# Patient Record
Sex: Male | Born: 1970 | Race: Black or African American | Hispanic: No | Marital: Married | Smoking: Never smoker
Health system: Southern US, Community
[De-identification: ages and names within clinical notes are randomized; demographics above are authoritative.]

## PROBLEM LIST (undated history)

## (undated) DIAGNOSIS — I10 Essential (primary) hypertension: Secondary | ICD-10-CM

## (undated) HISTORY — PX: KIDNEY DONATION: SHX685

## (undated) HISTORY — PX: KNEE ARTHROSCOPY W/ MEDIAL COLLATERAL LIGAMENT (MCL) REPAIR: SHX1876

---

## 2013-08-25 DIAGNOSIS — Z524 Kidney donor: Secondary | ICD-10-CM | POA: Insufficient documentation

## 2018-11-18 DIAGNOSIS — I10 Essential (primary) hypertension: Secondary | ICD-10-CM | POA: Diagnosis not present

## 2018-11-18 DIAGNOSIS — J069 Acute upper respiratory infection, unspecified: Secondary | ICD-10-CM | POA: Diagnosis not present

## 2018-11-18 DIAGNOSIS — Z6828 Body mass index (BMI) 28.0-28.9, adult: Secondary | ICD-10-CM | POA: Diagnosis not present

## 2019-01-14 DIAGNOSIS — J309 Allergic rhinitis, unspecified: Secondary | ICD-10-CM | POA: Diagnosis not present

## 2019-01-14 DIAGNOSIS — J45909 Unspecified asthma, uncomplicated: Secondary | ICD-10-CM | POA: Diagnosis not present

## 2019-01-14 DIAGNOSIS — I10 Essential (primary) hypertension: Secondary | ICD-10-CM | POA: Diagnosis not present

## 2019-02-16 DIAGNOSIS — I10 Essential (primary) hypertension: Secondary | ICD-10-CM | POA: Diagnosis not present

## 2019-02-16 DIAGNOSIS — J45909 Unspecified asthma, uncomplicated: Secondary | ICD-10-CM | POA: Diagnosis not present

## 2019-02-16 DIAGNOSIS — J309 Allergic rhinitis, unspecified: Secondary | ICD-10-CM | POA: Diagnosis not present

## 2019-05-05 ENCOUNTER — Other Ambulatory Visit: Payer: Self-pay | Admitting: *Deleted

## 2019-05-05 DIAGNOSIS — Z20822 Contact with and (suspected) exposure to covid-19: Secondary | ICD-10-CM

## 2019-05-11 LAB — NOVEL CORONAVIRUS, NAA: SARS-CoV-2, NAA: NOT DETECTED

## 2019-09-19 ENCOUNTER — Other Ambulatory Visit: Payer: Self-pay

## 2019-09-19 DIAGNOSIS — Z20822 Contact with and (suspected) exposure to covid-19: Secondary | ICD-10-CM

## 2019-09-22 LAB — NOVEL CORONAVIRUS, NAA: SARS-CoV-2, NAA: NOT DETECTED

## 2020-02-15 ENCOUNTER — Other Ambulatory Visit: Payer: Self-pay

## 2020-02-15 ENCOUNTER — Encounter (INDEPENDENT_AMBULATORY_CARE_PROVIDER_SITE_OTHER): Payer: Self-pay | Admitting: Otolaryngology

## 2020-02-15 ENCOUNTER — Ambulatory Visit (INDEPENDENT_AMBULATORY_CARE_PROVIDER_SITE_OTHER): Payer: Self-pay | Admitting: Otolaryngology

## 2020-02-15 VITALS — Temp 97.7°F

## 2020-02-15 DIAGNOSIS — M26609 Unspecified temporomandibular joint disorder, unspecified side: Secondary | ICD-10-CM

## 2020-02-15 DIAGNOSIS — H60312 Diffuse otitis externa, left ear: Secondary | ICD-10-CM

## 2020-02-15 NOTE — Progress Notes (Signed)
HPI: Jason Olsen is a 49 y.o. male who returns today for evaluation of left ear discomfort.  He noticed this worse when he was chewing gum.  He just recently came back from a trip.  He has not noted any hearing problems.  No drainage from the ear..  No past medical history on file.  Social History   Socioeconomic History  . Marital status: Married    Spouse name: Not on file  . Number of children: Not on file  . Years of education: Not on file  . Highest education level: Not on file  Occupational History  . Not on file  Tobacco Use  . Smoking status: Never Smoker  . Smokeless tobacco: Never Used  Substance and Sexual Activity  . Alcohol use: Not on file  . Drug use: Not on file  . Sexual activity: Not on file  Other Topics Concern  . Not on file  Social History Narrative  . Not on file   Social Determinants of Health   Financial Resource Strain:   . Difficulty of Paying Living Expenses:   Food Insecurity:   . Worried About Programme researcher, broadcasting/film/video in the Last Year:   . Barista in the Last Year:   Transportation Needs:   . Freight forwarder (Medical):   Marland Kitchen Lack of Transportation (Non-Medical):   Physical Activity:   . Days of Exercise per Week:   . Minutes of Exercise per Session:   Stress:   . Feeling of Stress :   Social Connections:   . Frequency of Communication with Friends and Family:   . Frequency of Social Gatherings with Friends and Family:   . Attends Religious Services:   . Active Member of Clubs or Organizations:   . Attends Banker Meetings:   Marland Kitchen Marital Status:    No family history on file. Allergies  Allergen Reactions  . Penicillins    Prior to Admission medications   Not on File     Positive ROS: Otherwise negative  All other systems have been reviewed and were otherwise negative with the exception of those mentioned in the HPI and as above.  Physical Exam: Constitutional: Alert, well-appearing, no acute  distress Ears: External ears without lesions or tenderness.  Right external ear canal is clear with normal-appearing cerumen that was removed.  Right TM is clear.  Left external ear canal has slight inflammation but no drainage and no significant swelling.  This was cleaned with suction and applied CSF powder to the left ear canal.  The left TM is clear with good mobility on pneumatic otoscopy.  Hearing screening with tuning forks revealed symmetric hearing in both ears with AC > BC bilaterally. Nasal: External nose without lesions. Clear nasal passages Oral: Lips and gums without lesions. Tongue and palate mucosa without lesions. Posterior oropharynx clear. Neck: No palpable adenopathy or masses.  He has slight discomfort on opening and closing his mouth in the left TMJ region. Respiratory: Breathing comfortably  Skin: No facial/neck lesions or rash noted.  Cerumen impaction removal  Date/Time: 02/15/2020 4:26 PM Performed by: Drema Halon, MD Authorized by: Drema Halon, MD   Consent:    Consent obtained:  Verbal   Consent given by:  Patient   Risks discussed:  Pain and bleeding Procedure details:    Location:  L ear and R ear   Procedure type: suction   Post-procedure details:    Inspection:  TM intact and canal  normal   Hearing quality:  Improved   Patient tolerance of procedure:  Tolerated well, no immediate complications Comments:     Both ear canals were cleaned.  TMs were clear bilaterally.  He had questionable left external otitis.  I applied CSF powder to the left ear canal.    Assessment: Left otalgia questionable secondary to mild left external otitis versus left TMJ dysfunction.  Plan: Suggested initially taking nostril anti-inflammatory medication. Prescribed Cortisporin otic suspension 4 to 5 drops twice daily for 5 days if he has any persistent left ear pain or drainage from the ear.   Radene Journey, MD

## 2020-10-27 ENCOUNTER — Ambulatory Visit: Payer: Self-pay | Attending: Internal Medicine

## 2020-10-27 DIAGNOSIS — Z23 Encounter for immunization: Secondary | ICD-10-CM

## 2020-10-27 NOTE — Progress Notes (Signed)
   Covid-19 Vaccination Clinic  Name:  [name]    MRN: [id number] DOB: 1971-08-11  10/27/2020  Mr. Arzuaga was observed post Covid-19 immunization for 15 minutes without incident. He was provided with Vaccine Information Sheet and instruction to access the V-Safe system.   Mr. Heist was instructed to call 911 with any severe reactions post vaccine: Marland Kitchen Difficulty breathing  . Swelling of face and throat  . A fast heartbeat  . A bad rash all over body  . Dizziness and weakness   Immunizations Administered    Name Date Dose VIS Date Route   Moderna Covid-19 Booster Vaccine 10/27/2020  1:28 PM 0.25 mL 08/17/2020 Intramuscular   Manufacturer: Moderna   Lot: [id number]   NDC: 38333-832-91

## 2021-08-17 DIAGNOSIS — Z524 Kidney donor: Secondary | ICD-10-CM | POA: Insufficient documentation

## 2021-08-17 DIAGNOSIS — E119 Type 2 diabetes mellitus without complications: Secondary | ICD-10-CM | POA: Insufficient documentation

## 2021-08-17 DIAGNOSIS — R7303 Prediabetes: Secondary | ICD-10-CM | POA: Insufficient documentation

## 2021-08-17 DIAGNOSIS — E1169 Type 2 diabetes mellitus with other specified complication: Secondary | ICD-10-CM | POA: Insufficient documentation

## 2021-08-17 DIAGNOSIS — I10 Essential (primary) hypertension: Secondary | ICD-10-CM | POA: Insufficient documentation

## 2021-08-17 DIAGNOSIS — I152 Hypertension secondary to endocrine disorders: Secondary | ICD-10-CM | POA: Insufficient documentation

## 2021-08-17 NOTE — Patient Instructions (Addendum)
It was nice to meet you.   Your BP goal is less than 130/80.   Blood work was ordered.     Medications changes include :  start lisinopril 10 mg daily   Your prescription(s) have been submitted to your pharmacy. Please take as directed and contact our office if you believe you are having problem(s) with the medication(s).   A referral was ordered for GI for a colonoscopy.       Someone from their office will call you to schedule an appointment.     Please followup in 6 months   Health Maintenance, Male Adopting a healthy lifestyle and getting preventive care are important in promoting health and wellness. Ask your health care provider about: The right schedule for you to have regular tests and exams. Things you can do on your own to prevent diseases and keep yourself healthy. What should I know about diet, weight, and exercise? Eat a healthy diet  Eat a diet that includes plenty of vegetables, fruits, low-fat dairy products, and lean protein. Do not eat a lot of foods that are high in solid fats, added sugars, or sodium. Maintain a healthy weight Body mass index (BMI) is a measurement that can be used to identify possible weight problems. It estimates body fat based on height and weight. Your health care provider can help determine your BMI and help you achieve or maintain a healthy weight. Get regular exercise Get regular exercise. This is one of the most important things you can do for your health. Most adults should: Exercise for at least 150 minutes each week. The exercise should increase your heart rate and make you sweat (moderate-intensity exercise). Do strengthening exercises at least twice a week. This is in addition to the moderate-intensity exercise. Spend less time sitting. Even light physical activity can be beneficial. Watch cholesterol and blood lipids Have your blood tested for lipids and cholesterol at 49 years of age, then have this test every 5 years. You  may need to have your cholesterol levels checked more often if: Your lipid or cholesterol levels are high. You are older than 48 years of age. You are at high risk for heart disease. What should I know about cancer screening? Many types of cancers can be detected early and may often be prevented. Depending on your health history and family history, you may need to have cancer screening at various ages. This may include screening for: Colorectal cancer. Prostate cancer. Skin cancer. Lung cancer. What should I know about heart disease, diabetes, and high blood pressure? Blood pressure and heart disease High blood pressure causes heart disease and increases the risk of stroke. This is more likely to develop in people who have high blood pressure readings, are of African descent, or are overweight. Talk with your health care provider about your target blood pressure readings. Have your blood pressure checked: Every 3-5 years if you are 72-67 years of age. Every year if you are 43 years old or older. If you are between the ages of 22 and 97 and are a current or former smoker, ask your health care provider if you should have a one-time screening for abdominal aortic aneurysm (AAA). Diabetes Have regular diabetes screenings. This checks your fasting blood sugar level. Have the screening done: Once every three years after age 72 if you are at a normal weight and have a low risk for diabetes. More often and at a younger age if you are overweight or have a high  risk for diabetes. What should I know about preventing infection? Hepatitis B If you have a higher risk for hepatitis B, you should be screened for this virus. Talk with your health care provider to find out if you are at risk for hepatitis B infection. Hepatitis C Blood testing is recommended for: Everyone born from [birth year] through 1963. Anyone with known risk factors for hepatitis C. Sexually transmitted infections (STIs) You should be  screened each year for STIs, including gonorrhea and chlamydia, if: You are sexually active and are younger than 48 years of age. You are older than 48 years of age and your health care provider tells you that you are at risk for this type of infection. Your sexual activity has changed since you were last screened, and you are at increased risk for chlamydia or gonorrhea. Ask your health care provider if you are at risk. Ask your health care provider about whether you are at high risk for HIV. Your health care provider may recommend a prescription medicine to help prevent HIV infection. If you choose to take medicine to prevent HIV, you should first get tested for HIV. You should then be tested every 3 months for as long as you are taking the medicine. Follow these instructions at home: Lifestyle Do not use any products that contain nicotine or tobacco, such as cigarettes, e-cigarettes, and chewing tobacco. If you need help quitting, ask your health care provider. Do not use street drugs. Do not share needles. Ask your health care provider for help if you need support or information about quitting drugs. Alcohol use Do not drink alcohol if your health care provider tells you not to drink. If you drink alcohol: Limit how much you have to 0-2 drinks a day. Be aware of how much alcohol is in your drink. In the [place] one drink equals one 12 oz bottle of beer (355 mL), one 5 oz glass of wine (148 mL), or one 1 oz glass of hard liquor (44 mL). General instructions Schedule regular health, dental, and eye exams. Stay current with your vaccines. Tell your health care provider if: You often feel depressed. You have ever been abused or do not feel safe at home. Summary Adopting a healthy lifestyle and getting preventive care are important in promoting health and wellness. Follow your health care provider's instructions about healthy diet, exercising, and getting tested or screened for  diseases. Follow your health care provider's instructions on monitoring your cholesterol and blood pressure. This information is not intended to replace advice given to you by your health care provider. Make sure you discuss any questions you have with your health care provider. Document Revised: 12/23/2020 Document Reviewed: 10/08/2018 Elsevier Patient Education  2022 [employer].

## 2021-08-17 NOTE — Progress Notes (Signed)
Subjective:    Patient ID: Jason Olsen, male    DOB: 29-May-1971, 50 y.o.   MRN: [id number]   This visit occurred during the SARS-CoV-2 public health emergency.  Safety protocols were in place, including screening questions prior to the visit, additional usage of staff PPE, and extensive cleaning of exam room while observing appropriate contact time as indicated for disinfecting solutions.   HPI He is here to establish with a new pcp.   He is here for a physical exam.  His wife is here with him.  He is concerned about his blood pressure-when he does check it at home it tends to be higher than usual.  He is also concerned about his kidney.  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    Medications and allergies reviewed with patient and updated if appropriate.  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     B Complex-C (B-COMPLEX WITH VITAMIN C) tablet Take by mouth.     fluticasone (FLONASE) 50 MCG/ACT nasal spray 2 Sprays by each nostril route Once a Day.     tadalafil (CIALIS) 10 MG tablet Take 10 mg by mouth as needed.     No current facility-administered medications on file prior to visit.    History reviewed. No pertinent past medical history.  History reviewed. No pertinent surgical history.  Social History   Socioeconomic History   Marital status: Married    Spouse name: Not on file   Number of children: Not on file   Years of education: Not on file   Highest education level: Not on file  Occupational History   Not on file  Tobacco Use   Smoking status: Never   Smokeless tobacco:  Never  Substance and Sexual Activity   Alcohol use: Not on file   Drug use: Not on file   Sexual activity: Not on file  Other Topics Concern   Not on file  Social History Narrative   Not on file   Social Determinants of Health   Financial Resource Strain: Not on file  Food Insecurity: Not on file  Transportation Needs: Not on file  Physical Activity: Not on file  Stress: Not on file  Social Connections: Not on file    Family History  Problem Relation Age of Onset   Cancer Mother        breast   Hypertension Mother    Cerebral aneurysm Mother    Kidney disease Father    Heart disease Father    Diabetes Father     Review of Systems  Constitutional:  Negative for chills and fever.  HENT:  Negative for rhinorrhea.   Eyes:  Negative for visual disturbance.  Respiratory:  Negative for cough, shortness of breath and wheezing.   Cardiovascular:  Positive for palpitations (1-2 times) and leg swelling (mild - wears comopresion socks). Negative for chest pain.  Gastrointestinal:  Negative for abdominal pain, blood in stool, constipation, diarrhea and nausea.       Rare gerd  Genitourinary:  Negative for difficulty urinating and dysuria.  Musculoskeletal:  Positive for arthralgias (mild) and back pain (lower - tightness).  Skin:  Negative for rash.  Neurological:  Negative for dizziness, light-headedness and headaches.  Psychiatric/Behavioral:  Negative for dysphoric mood. The patient is not nervous/anxious.       Objective:   Vitals:   08/18/21 1014  BP: (!) 150/98  Pulse: 80  Temp: 98.1 F (36.7 C)  SpO2: 97%   Filed Weights   08/18/21 1014  Weight: 221 lb (100.2 kg)   Body mass index is 30.82 kg/m.  BP Readings from Last 3 Encounters:  08/18/21 (!) 150/98    Wt Readings from Last 3 Encounters:  08/18/21 221 lb (100.2 kg)     Physical Exam Constitutional: He appears well-developed and well-nourished. No distress.  HENT:  Head: Normocephalic and  atraumatic.  Right Ear: External ear normal.  Left Ear: External ear normal.  Mouth/Throat: Oropharynx is clear and moist.  Normal ear canals and TM b/l  Eyes: Conjunctivae and EOM are normal.  Neck: Neck supple. No tracheal deviation present. No thyromegaly present.  No carotid bruit  Cardiovascular: Normal rate, regular rhythm, normal heart sounds and intact distal pulses.   No murmur heard. Pulmonary/Chest: Effort normal and breath sounds normal. No respiratory distress. He has no wheezes. He has no rales.  Abdominal: Soft. He exhibits no distension. There is no tenderness.  Genitourinary: deferred  Musculoskeletal: He exhibits no edema.  Lymphadenopathy:   He has no cervical adenopathy.  Skin: Skin is warm and dry. He is not diaphoretic.  Psychiatric: He has a normal mood and affect. His behavior is normal.         Assessment & Plan:   Physical exam: Screening blood work  ordered Exercise   not regular-stopped when he had some back pain Weight  ok for build Substance abuse   none   Reviewed recommended immunizations.  Referral ordered for GI for screening colonoscopy   Health Maintenance  Topic Date Due   COLONOSCOPY (Pts 45-2yrs Insurance coverage will need to be confirmed)  Never done   COVID-19 Vaccine (1) 09/03/2021 (Originally 08/14/1971)   INFLUENZA VACCINE  01/26/2022 (Originally 05/29/2021)   TETANUS/TDAP  03/10/2023   Hepatitis C Screening  Completed   HIV Screening  Completed   Pneumococcal Vaccine 62-14 Years old  Aged Out   HPV VACCINES  Aged Out     See Problem List for Assessment and Plan of chronic medical problems.

## 2021-08-18 ENCOUNTER — Other Ambulatory Visit: Payer: Self-pay

## 2021-08-18 ENCOUNTER — Ambulatory Visit: Payer: 59 | Admitting: Internal Medicine

## 2021-08-18 ENCOUNTER — Encounter: Payer: Self-pay | Admitting: Internal Medicine

## 2021-08-18 VITALS — BP 150/98 | HR 80 | Temp 98.1°F | Ht 71.0 in | Wt 221.0 lb

## 2021-08-18 DIAGNOSIS — I10 Essential (primary) hypertension: Secondary | ICD-10-CM | POA: Diagnosis not present

## 2021-08-18 DIAGNOSIS — Z125 Encounter for screening for malignant neoplasm of prostate: Secondary | ICD-10-CM

## 2021-08-18 DIAGNOSIS — J309 Allergic rhinitis, unspecified: Secondary | ICD-10-CM | POA: Diagnosis not present

## 2021-08-18 DIAGNOSIS — Z524 Kidney donor: Secondary | ICD-10-CM | POA: Diagnosis not present

## 2021-08-18 DIAGNOSIS — Z0000 Encounter for general adult medical examination without abnormal findings: Secondary | ICD-10-CM | POA: Diagnosis not present

## 2021-08-18 DIAGNOSIS — Z1211 Encounter for screening for malignant neoplasm of colon: Secondary | ICD-10-CM

## 2021-08-18 DIAGNOSIS — E782 Mixed hyperlipidemia: Secondary | ICD-10-CM

## 2021-08-18 DIAGNOSIS — R7303 Prediabetes: Secondary | ICD-10-CM | POA: Diagnosis not present

## 2021-08-18 DIAGNOSIS — E1169 Type 2 diabetes mellitus with other specified complication: Secondary | ICD-10-CM | POA: Insufficient documentation

## 2021-08-18 DIAGNOSIS — E785 Hyperlipidemia, unspecified: Secondary | ICD-10-CM | POA: Insufficient documentation

## 2021-08-18 DIAGNOSIS — J45909 Unspecified asthma, uncomplicated: Secondary | ICD-10-CM

## 2021-08-18 LAB — COMPREHENSIVE METABOLIC PANEL
ALT: 33 U/L (ref 0–53)
AST: 27 U/L (ref 0–37)
Albumin: 4.6 g/dL (ref 3.5–5.2)
Alkaline Phosphatase: 93 U/L (ref 39–117)
BUN: 15 mg/dL (ref 6–23)
CO2: 31 mEq/L (ref 19–32)
Calcium: 9.4 mg/dL (ref 8.4–10.5)
Chloride: 103 mEq/L (ref 96–112)
Creatinine, Ser: 1.32 mg/dL (ref 0.40–1.50)
GFR: 63.78 mL/min (ref >60.00)
Glucose, Bld: 107 mg/dL — ABNORMAL HIGH (ref 70–99)
Potassium: 4.2 mEq/L (ref 3.5–5.1)
Sodium: 140 mEq/L (ref 135–145)
Total Bilirubin: 0.6 mg/dL (ref 0.2–1.2)
Total Protein: 7.8 g/dL (ref 6.0–8.3)

## 2021-08-18 LAB — TSH: TSH: 1.06 u[IU]/mL (ref 0.35–5.50)

## 2021-08-18 LAB — CBC WITH DIFFERENTIAL/PLATELET
Basophils Absolute: 0 10*3/uL (ref 0.0–0.1)
Basophils Relative: 0.5 % (ref 0.0–3.0)
Eosinophils Absolute: 0.2 10*3/uL (ref 0.0–0.7)
Eosinophils Relative: 3.6 % (ref 0.0–5.0)
HCT: 43.8 % (ref 39.0–52.0)
Hemoglobin: 14.7 g/dL (ref 13.0–17.0)
Lymphocytes Relative: 39.2 % (ref 12.0–46.0)
Lymphs Abs: 1.8 10*3/uL (ref 0.7–4.0)
MCHC: 33.5 g/dL (ref 30.0–36.0)
MCV: 86.4 fl (ref 78.0–100.0)
Monocytes Absolute: 0.5 10*3/uL (ref 0.1–1.0)
Monocytes Relative: 10.4 % (ref 3.0–12.0)
Neutro Abs: 2.1 10*3/uL (ref 1.4–7.7)
Neutrophils Relative %: 46.3 % (ref 43.0–77.0)
Platelets: 205 10*3/uL (ref 150.0–400.0)
RBC: 5.07 Mil/uL (ref 4.22–5.81)
RDW: 13 % (ref 11.5–15.5)
WBC: 4.5 10*3/uL (ref 4.0–10.5)

## 2021-08-18 LAB — LIPID PANEL
Cholesterol: 183 mg/dL (ref 0–200)
HDL: 49.4 mg/dL (ref >39.00)
LDL Cholesterol: 121 mg/dL — ABNORMAL HIGH (ref 0–99)
NonHDL: 133.33
Total CHOL/HDL Ratio: 4
Triglycerides: 61 mg/dL (ref 0.0–149.0)
VLDL: 12.2 mg/dL (ref 0.0–40.0)

## 2021-08-18 LAB — HEMOGLOBIN A1C: Hgb A1c MFr Bld: 6.6 % — ABNORMAL HIGH (ref 4.6–6.5)

## 2021-08-18 MED ORDER — CETIRIZINE HCL 10 MG PO TABS: 10.0000 mg | ORAL_TABLET | Freq: Every day | ORAL | 11 refills | Status: DC

## 2021-08-18 MED ORDER — ALBUTEROL SULFATE HFA 108 (90 BASE) MCG/ACT IN AERS: 1.0000 | INHALATION_SPRAY | RESPIRATORY_TRACT | 8 refills | Status: DC | PRN

## 2021-08-18 MED ORDER — MONTELUKAST SODIUM 10 MG PO TABS: 10.0000 mg | ORAL_TABLET | Freq: Every day | ORAL | 2 refills | Status: DC

## 2021-08-18 MED ORDER — LISINOPRIL 10 MG PO TABS: 10.0000 mg | ORAL_TABLET | Freq: Every day | ORAL | 3 refills | Status: DC

## 2021-08-18 MED ORDER — AMLODIPINE BESYLATE 5 MG PO TABS: 5.0000 mg | ORAL_TABLET | Freq: Every day | ORAL | 2 refills | Status: DC

## 2021-08-18 NOTE — Assessment & Plan Note (Signed)
Chronic Check a1c Low sugar / carb diet Stressed regular exercise  

## 2021-08-18 NOTE — Assessment & Plan Note (Signed)
Chronic Regular exercise and healthy diet encouraged Check lipid panel  Continue atorvastatin 40 mg daily 

## 2021-08-18 NOTE — Assessment & Plan Note (Signed)
Chronic Controlled Continue montelukast 10 mg nightly, Zyrtec 10 mg daily

## 2021-08-18 NOTE — Assessment & Plan Note (Signed)
Chronic Not ideally controlled Currently taking amlodipine 5 mg daily-continue Start lisinopril 10 mg daily-he did well with lisinopril in the past CMP Advised him to monitor his blood pressure at home-discussed goal of less than 130/80 Stressed low-sodium diet, regular exercise, weight loss

## 2021-08-18 NOTE — Assessment & Plan Note (Signed)
CMP today Stressed good blood pressure control No NSAIDs Good hydration Advised weight loss Low-sodium diet Stressed good sugar control

## 2021-08-18 NOTE — Assessment & Plan Note (Signed)
Chronic Exercise-induced Continue albuterol inhaler as needed

## 2021-08-21 LAB — PSA, TOTAL AND FREE
PSA, % Free: 60 % (calc) (ref >25)
PSA, Free: 0.3 ng/mL
PSA, Total: 0.5 ng/mL (ref <=4.0)

## 2021-11-02 ENCOUNTER — Encounter: Payer: Self-pay | Admitting: Internal Medicine

## 2021-11-02 NOTE — Progress Notes (Addendum)
Subjective:    Patient ID: Jason Olsen, male    DOB: [birth year], 51 y.o.   MRN: [id number]  This visit occurred during the SARS-CoV-2 public health emergency.  Safety protocols were in place, including screening questions prior to the visit, additional usage of staff PPE, and extensive cleaning of exam room while observing appropriate contact time as indicated for disinfecting solutions.    HPI The patient is here for an acute visit.   Right lower leg swelling and knee pain x 2 weeks- he had a mesniscus repair in 2013 or 2014.  It started swelling and hurts throughout hte knee.  He tried icing it and it did not help. He started wearing a brace and it helps.  He thinks it all started after twisting or stepping wrong on his knee.    He has something on his right lateral eyeball or upper eyelid. There is discomfort and discharge.  Some blurriness with the drainage.  He is taking all of his medications as prescribed.  His BP has been a little elevated at home.    Medications and allergies reviewed with patient and updated if appropriate.  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 6.7 g 8   amLODipine (NORVASC) 5 MG tablet Take 1 tablet (5 mg total) by mouth daily. 90 tablet 2   atorvastatin (LIPITOR) 40 MG tablet Take 40 mg by mouth at bedtime.     B Complex-C (B-COMPLEX WITH VITAMIN C) tablet Take by mouth.     cetirizine (ZYRTEC) 10 MG tablet Take 1 tablet (10 mg total) by mouth daily. 30 tablet 11   fluticasone (FLONASE) 50 MCG/ACT nasal spray 2 Sprays  by each nostril route Once a Day.     lisinopril (ZESTRIL) 10 MG tablet Take 1 tablet (10 mg total) by mouth daily. 90 tablet 3   montelukast (SINGULAIR) 10 MG tablet Take 1 tablet (10 mg total) by mouth at bedtime. 90 tablet 2   tadalafil (CIALIS) 10 MG tablet Take 10 mg by mouth as needed.     No current facility-administered medications on file prior to visit.    History reviewed. No pertinent past medical history.  History reviewed. No pertinent surgical history.  Social History   Socioeconomic History   Marital status: Married    Spouse name: Not on file   Number of children: Not on file   Years of education: Not on file   Highest education level: Not on file  Occupational History   Not on file  Tobacco Use   Smoking status: Never   Smokeless tobacco: Never  Substance and Sexual Activity   Alcohol use: Not on file   Drug use: Not on file   Sexual activity: Not on file  Other Topics Concern   Not on file  Social History Narrative   Not on file   Social Determinants of Health   Financial Resource Strain: Not on file  Food Insecurity: Not on file  Transportation Needs: Not on file  Physical Activity: Not on file  Stress: Not on  file  Social Connections: Not on file    Family History  Problem Relation Age of Onset   Cancer Mother        breast   Hypertension Mother    Cerebral aneurysm Mother    Kidney disease Father    Heart disease Father    Diabetes Father     Review of Systems  Constitutional:  Negative for fever.  Respiratory:  Negative for shortness of breath.   Cardiovascular:  Positive for leg swelling. Negative for chest pain and palpitations.  Musculoskeletal:  Positive for joint swelling.  Skin:  Negative for color change.      Objective:   Vitals:   11/03/21 1032  BP: (!) 148/92  Pulse: 84  Temp: 98 F (36.7 C)  SpO2: 98%   BP Readings from Last 3 Encounters:  11/03/21 (!) 148/92  08/18/21 (!) 150/98   Wt Readings from Last 3  Encounters:  11/03/21 218 lb (98.9 kg)  08/18/21 221 lb (100.2 kg)   Body mass index is 30.4 kg/m.   Physical Exam    A right knee exam was performed.   SKIN: intact,  SWELLING: mild  EFFUSION: yes  WARMTH: no warmth  TENDERNESS: moderate tenderness all aspects of knee  ROM: full extension, full flexion but with pain GAIT: normal NEUROLOGICAL EXAM: normal sensation  CALF TENDERNESS: no       Assessment & Plan:    See Problem List for Assessment and Plan of chronic medical problems.

## 2021-11-03 ENCOUNTER — Ambulatory Visit (INDEPENDENT_AMBULATORY_CARE_PROVIDER_SITE_OTHER): Payer: 59

## 2021-11-03 ENCOUNTER — Ambulatory Visit: Payer: 59 | Admitting: Internal Medicine

## 2021-11-03 ENCOUNTER — Other Ambulatory Visit: Payer: Self-pay

## 2021-11-03 ENCOUNTER — Other Ambulatory Visit: Payer: Self-pay | Admitting: Sports Medicine

## 2021-11-03 ENCOUNTER — Ambulatory Visit: Payer: 59 | Admitting: Sports Medicine

## 2021-11-03 VITALS — BP 140/90 | HR 80 | Ht 71.0 in | Wt 219.0 lb

## 2021-11-03 VITALS — BP 148/92 | HR 84 | Temp 98.0°F | Ht 71.0 in | Wt 218.0 lb

## 2021-11-03 DIAGNOSIS — M25561 Pain in right knee: Secondary | ICD-10-CM

## 2021-11-03 DIAGNOSIS — I10 Essential (primary) hypertension: Secondary | ICD-10-CM | POA: Diagnosis not present

## 2021-11-03 DIAGNOSIS — Z9889 Other specified postprocedural states: Secondary | ICD-10-CM | POA: Diagnosis not present

## 2021-11-03 MED ORDER — LISINOPRIL 20 MG PO TABS: 20.0000 mg | ORAL_TABLET | Freq: Every day | ORAL | 1 refills | Status: DC

## 2021-11-03 NOTE — Patient Instructions (Addendum)
° °  Go downstairs and schedule an appointment with sports medicine -- right knee pain and swelling x 2 weeks  (referral ordered)     Medications changes include :   increase lisinopril to 20 mg daily  Your prescription(s) have been submitted to your pharmacy. Please take as directed and contact our office if you believe you are having problem(s) with the medication(s).

## 2021-11-03 NOTE — Assessment & Plan Note (Signed)
Chronic BP not ideally controlled Continue amlodipine 5 mg daily, increase lisinopril to 20 mg daily F/u in April as scheduled

## 2021-11-03 NOTE — Assessment & Plan Note (Signed)
Acute Swelling and pain x 2 weeks Will see if sports med can see him today

## 2021-11-03 NOTE — Progress Notes (Signed)
Jason Olsen [name] [address] [place] [address] Phone: [phone]   Assessment and Plan:     1. Acute pain of right knee 2. History of medial meniscus repair of right knee -Chronic with exacerbation, initial sports medicine visit - Acute, 2 weeks of right knee pain and swelling without MOI with history of right meniscal repair around 2013-2014 per patient - Likely acute flare of chronic osteoarthritis of right knee with history of meniscal repair - Start HEP and physical therapy referral sent and handout provided - Patient elects for CSI.  Tolerated well per note below - X-ray obtained in clinic.  My interpretation: No acute fracture or dislocation.  Mild decrease space and medial compartment with increased cortical density indicating mild medial compartment osteoarthritis  Procedure: Knee Joint Injection Side: Right Indication: Right knee pain  Risks explained and consent was given verbally. The site was cleaned with alcohol prep. A needle was introduced with an anterio-lateral approach. Injection given using 62mL of 1% lidocaine without epinephrine and 17mL of kenalog 40mg /ml. This was well tolerated and resulted in symptomatic relief.  Needle was removed, hemostasis achieved, and post injection instructions were explained.   Pt was advised to call or return to clinic if these symptoms worsen or fail to improve as anticipated.    Pertinent previous records reviewed include PCP note from 11/03/2021   Follow Up: 4 weeks for reevaluation.  Could consider ultrasound versus formal physical therapy versus advanced imaging at that time   Subjective:   I, 01/01/2022, am serving as a Jason Olsen for Doctor Neurosurgeon Chief Complaint: right knee pain and swelling  HPI:   11/03/21 Patient is a 51 year old male complaining of right knee pain and swelling. Patient states that he had meniscus repair (2014) has flare ups. 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   Current Outpatient Medications:    albuterol (VENTOLIN HFA) 108 (90 Base) MCG/ACT inhaler, Inhale 1-2 puffs into the lungs every 4 (four) hours as needed for wheezing or shortness of breath., Disp: 6.7 g, Rfl: 8   amLODipine (NORVASC) 5 MG tablet, Take 1 tablet (5 mg total) by mouth daily., Disp: 90 tablet, Rfl: 2   atorvastatin (LIPITOR) 40 MG tablet, Take 40 mg by mouth at bedtime., Disp: , Rfl:    B Complex-C (B-COMPLEX WITH VITAMIN C) tablet, Take by mouth., Disp: , Rfl:    cetirizine (ZYRTEC) 10 MG tablet, Take 1 tablet (10 mg total) by mouth daily., Disp: 30 tablet, Rfl: 11   fluticasone (FLONASE) 50 MCG/ACT nasal spray, 2 Sprays by each nostril route Once a Day., Disp: , Rfl:    lisinopril (ZESTRIL) 20 MG tablet, Take 1 tablet (20 mg total) by mouth daily., Disp: 90 tablet, Rfl: 1   montelukast (SINGULAIR) 10 MG tablet, Take 1 tablet (10 mg total) by mouth at bedtime., Disp: 90 tablet, Rfl: 2   tadalafil (CIALIS) 10  MG tablet, Take 10 mg by mouth as needed., Disp: , Rfl:    Objective:     Vitals:   11/03/21 1303  BP: 140/90  Pulse: 80  SpO2: 98%  Weight: 219 lb (99.3 kg)  Height: 5\' 11"  (1.803 m)      Body mass index is 30.54 kg/m.    Physical Exam:    General:  awake, alert oriented, no acute distress nontoxic Skin: no suspicious lesions or rashes Neuro:sensation intact, no deficits, strength 5/5 with no deficits, no atrophy, normal muscle  tone Psych: No signs of anxiety, depression or other mood disorder  Knee: Mild swelling No deformity Positive fluid wave, joint milking ROM Flex 100, Ext 5 TTP medial joint line, lateral joint line, posterior fossa, patella NTTP over the quad tendon, medial fem condyle, lat fem condyle,  patella tendon, tibial tuberostiy, fibular head, posterior fossa, pes anserine bursa, gerdy's tubercle,   Neg anterior and posterior drawer Neg lachman Neg sag sign Negative varus stress Negative valgus stress Negative McMurray Positive Thessaly for anterior knee pain  Gait normal    Electronically signed by:  D.[facility] 1:58 PM 11/03/21

## 2021-11-03 NOTE — Patient Instructions (Addendum)
Good to see you  °Referral for physical therapy  °4 week follow up  ° °

## 2021-11-21 NOTE — Therapy (Signed)
OUTPATIENT PHYSICAL THERAPY LOWER EXTREMITY EVALUATION   Patient Name: Jason Olsen MRN: [id number] DOB:07-Mar-1971, 51 y.o., male Today's Date: 11/25/2021   PT End of Session - 11/25/21 1118     Visit Number 1    Number of Visits 16    Date for PT Re-Evaluation 01/20/22    PT Start Time 1000    PT Stop Time [id number]    PT Time Calculation (min) 35 min             History reviewed. No pertinent past medical history. History reviewed. No pertinent surgical history. 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 male who presents to clinic with chief complaint of R knee pain.  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  This resulted in delayed onset swelling.  X-rays revealed arthritis.  He had steroid injection which was helpful.  When it is cold it is more painful.  Endorses clicking and popping, denies locking.  From referring provider:   "HPI:    11/03/21 Patient is a 51 year old male complaining of right knee pain and swelling. Patient states that he had meniscus repair (2014) has flare ups. 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 Took some ibuprofen and it worked a little but now it doesn't work has not been able to stretch do to stretching being painful , one day the pain shot down to the achilles and then back up    Relevant Historical Information: Meniscal repair in 2013/2014 per patient, hypertension, donated right kidney in 2008/2009   Additional pertinent review of  systems negative."   Red flags:  denies   Pertinent past history:  None  Pain:  Are you having pain? Yes Pain location: Diffuse anterior joint-line pain NPRS scale:  highest 9/10 current 9/10  best 0/10 Aggravating factors: cold weather, walking (immediate), standing Relieving factors: rest, gentle movement Pain description: intermittent, constant, and sharp Severity: high Irritability: moderate Stage: Subacute Stability: staying the same 24 hour pattern: worse in the morning   Occupation: truck driver  Hobbies/Recreation: working out at [employer] Device: none  Higher education careers adviser Dominance: NA  Patient Goals: reduce pain, avoid surgery   PRECAUTIONS: None  WEIGHT BEARING RESTRICTIONS No  FALLS:  Has patient fallen in last 6 months? No, Number of falls: 0  LIVING ENVIRONMENT: Lives with: lives with their family Stairs: Yes; Internal: 10 steps; on right going up  PLOF: Independent  DIAGNOSTIC FINDINGS: x-ray revealed arthritis   OBJECTIVE:    GENERAL OBSERVATION:   Antalgic gait, reduced time in stance R, reduced step length and height R  SENSATION:  Light touch: Appears intact  PALPATION: Diffuse pain anterior joint line  LE MMT:  MMT Right 11/25/2021 Left 11/25/2021  Hip flexion (L2, L3)    Knee extension (L3) 4* 4+  Knee flexion 4 4+  Hip abduction 4 4  Hip extension 4 4  Hip external rotation    Hip internal rotation    Hip adduction    Ankle dorsiflexion  (L4)    Ankle plantarflexion (S1)    Ankle inversion    Ankle eversion    Great Toe ext (L5)     (Blank rows = not tested, score listed is out of 5 possible points.  N = WNL, D = diminished, C = clear for gross weakness with myotome testing, * = concordant pain with testing)  LE ROM:  ROM Right 11/25/2021 Left 11/25/2021  Hip flexion    Hip extension    Hip abduction    Hip adduction    Hip internal rotation    Hip external rotation    Knee flexion 110 125  Knee extension n n   Ankle dorsiflexion    Ankle plantarflexion    Ankle inversion    Ankle eversion     (Blank rows = not tested, N = WNL, * = concordant pain with testing)  LOWER EXTREMITY SPECIAL TESTS:  Knee special tests: McMurray's test: positive  R (lateral)  GAIT: Comments: see general observation  TODAY'S TREATMENT: Creating, reviewing, and completing below HEP   PATIENT EDUCATION:  POC, diagnosis, prognosis, HEP.  Pt educated via explanation, demonstration, and handout (HEP).  Pt confirms understanding verbally.    HOME EXERCISE PROGRAM: Access Code: [email or web address] URL: [email or web address] Date: 11/25/2021 Prepared by: Alphonzo Severance  Exercises Active Straight Leg Raise with Quad Set - 1 x daily - 7 x weekly - 3 sets - 10 reps Supine Quad Set - 3 x daily - 7 x weekly - 2 sets - 10 reps - 10 second hold Clamshell with Resistance - 1 x daily - 7 x weekly - 3 sets - 10 reps   ASSESSMENT:  CLINICAL IMPRESSION: Jason Olsen is a 51 y.o. male who presents to clinic with signs and sxs consistent with R knee pain secondary to meniscus pathology.  + McMurray's along with history and MOI rule up meniscus.  Patient presents with pain and impairments/deficits in: R knee ROM, gait, hip and knee strength.  Activity limitations include: walking and standing without pain.  Participation limitations include: ADLs involving  walking and standing without pain.  Patient will benefit from skilled therapy to  address pain and the listed deficits in order to achieve functional goals, enable safety and independence in completion of daily tasks, and return to PLOF.   REHAB POTENTIAL: Good  CLINICAL DECISION MAKING: Stable/uncomplicated  EVALUATION COMPLEXITY: Low   GOALS:  SHORT TERM GOALS:  STG Name Target Date Goal status  1 Jason Olsen will be >75% HEP compliant to improve carryover between sessions and facilitate independent management of condition  Baseline: No HEP 12/16/2021 INITIAL   LONG TERM GOALS:   LTG Name Target Date Goal status  1 Jason Olsen will report >/= 50% decrease in pain from evaluation   Baseline: 9/10 max pain 01/20/2022 INITIAL  2 Jason Olsen will improve the following MMTs to >/= 5/5 to show improvement in strength:  knee ext, hip abd, hip ext   Baseline: see chart 01/20/2022 INITIAL  3 Jason Olsen will be independent with advance HEP for indpendent management of condition 01/20/2022 INITIAL  4 Jason Olsen will achieve 120 degrees knee flexion by D/C (see POC end date) to improve ability to safely navigate steps in the community  Baseline: 110 degrees  01/20/2022 INITIAL   PLAN: PT FREQUENCY: 1-2x/week  PT DURATION: 8 weeks (Ending 01/20/2022)  PLANNED INTERVENTIONS: Therapeutic exercises, Therapeutic activity, Neuro Muscular re-education, Gait training, Patient/Family education, Joint mobilization, Dry Needling, Electrical stimulation, Spinal mobilization and/or manipulation, Moist heat, Taping, Vasopneumatic device, Ionotophoresis 4mg /ml Dexamethasone, and Manual therapy  PLAN FOR NEXT SESSION: progressive knee and hip strengthening   Shearon Balo PT, DPT 11/25/2021, 11:19 AM

## 2021-11-25 ENCOUNTER — Other Ambulatory Visit: Payer: Self-pay

## 2021-11-25 ENCOUNTER — Ambulatory Visit: Payer: 59 | Attending: Sports Medicine | Admitting: Physical Therapy

## 2021-11-25 ENCOUNTER — Encounter: Payer: Self-pay | Admitting: Physical Therapy

## 2021-11-25 DIAGNOSIS — Z9889 Other specified postprocedural states: Secondary | ICD-10-CM | POA: Insufficient documentation

## 2021-11-25 DIAGNOSIS — M25561 Pain in right knee: Secondary | ICD-10-CM | POA: Diagnosis present

## 2021-11-25 DIAGNOSIS — R600 Localized edema: Secondary | ICD-10-CM | POA: Diagnosis present

## 2021-11-30 NOTE — Progress Notes (Signed)
[facility]lsen [facility] [address] [place] [address] Phone: [phone]   Assessment and Plan:    1. History of medial meniscus repair of right knee 2. Acute pain of right knee 3. Primary osteoarthritis of right knee -Chronic with exacerbation, subsequent visit - Moderate improvement in right knee pain after CSI on office visit on 11/03/2021 and starting physical therapy - Continue physical therapy and HEP - Start Tylenol 500 to 1000 mg to 3 times a day for day-to-day pain - Can use NSAIDs for breakthrough pain   Pertinent previous records reviewed include none   Follow Up: As needed if no improvement or worsening of symptoms.  Could consider repeat injection 3 months after last injection on 11/03/2021   Subjective:   I, Jason Olsen, am serving as a Neurosurgeon for Doctor Jason Olsen  Chief Complaint: right knee pain   HPI:  11/03/21 Patient is a 51 year old male complaining of right knee pain and swelling. Patient states that he had meniscus repair (2014) has flare ups. 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 Took some ibuprofen and it worked a little but now it doesn't work has not been able to stretch do to stretching being painful , one day the pain shot down to the achilles and then back up    12/01/2021 Patient states that he's doing pretty after Pt was really sore other than that when it gets cold the knee lets him know but other than that he feels pretty good    Relevant Historical Information: Meniscal repair in 2013/2014 per patient, hypertension, donated right kidney in 2008/2009  Additional pertinent review of systems negative.   Current Outpatient Medications:    albuterol (VENTOLIN HFA) 108 (90 Base) MCG/ACT inhaler, Inhale 1-2 puffs into the lungs every 4 (four) hours  as needed for wheezing or shortness of breath., Disp: 6.7 g, Rfl: 8   amLODipine (NORVASC) 5 MG tablet, Take 1 tablet (5 mg total) by mouth daily., Disp: 90 tablet, Rfl: 2   atorvastatin (LIPITOR) 40 MG tablet, Take 40 mg by mouth at bedtime., Disp: , Rfl:    B Complex-C (B-COMPLEX WITH VITAMIN C) tablet, Take by mouth., Disp: , Rfl:    cetirizine (ZYRTEC) 10 MG tablet, Take 1 tablet (10 mg total) by mouth daily., Disp: 30 tablet, Rfl: 11   fluticasone (FLONASE) 50 MCG/ACT nasal spray, 2 Sprays by each nostril route Once a Day., Disp: , Rfl:    lisinopril (ZESTRIL) 20 MG tablet, Take 1 tablet (20 mg total) by mouth daily., Disp: 90 tablet, Rfl: 1   montelukast (SINGULAIR) 10 MG tablet, Take 1 tablet (10 mg total) by mouth at bedtime., Disp: 90 tablet, Rfl: 2   tadalafil (CIALIS) 10 MG tablet, Take 10 mg by mouth as needed., Disp: , Rfl:    Objective:     Vitals:   12/01/21 1530  BP: 132/90  Pulse: 97  SpO2: 97%  Weight: 220 lb (99.8 kg)  Height: 5\' 11"  (1.803 m)      Body mass index is 30.68 kg/m.    Physical Exam:    General:  awake, alert oriented, no acute distress nontoxic Skin: no suspicious lesions or rashes Neuro:sensation intact, no deficits, strength 5/5 with no deficits, no atrophy, normal muscle tone Psych:  No signs of anxiety, depression or other mood disorder  Knee: No swelling No deformity Neg fluid wave, joint milking ROM Flex 110 , Ext 0  TTP medial and lateral joint line NTTP over the quad tendon, medial fem condyle, lat fem condyle, patella, plica, patella tendon, tibial tuberostiy, fibular head, posterior fossa, pes anserine bursa, gerdy's tubercle,   Neg anterior and posterior drawer Neg lachman Neg sag sign Negative varus stress Negative valgus stress Negative McMurray Negative Thessaly  Gait normal    Electronically signed by:  Jason Olsen [facility] 3:45 PM 12/01/21

## 2021-12-01 ENCOUNTER — Ambulatory Visit: Payer: 59 | Admitting: Sports Medicine

## 2021-12-01 ENCOUNTER — Other Ambulatory Visit: Payer: Self-pay

## 2021-12-01 VITALS — BP 132/90 | HR 97 | Ht 71.0 in | Wt 220.0 lb

## 2021-12-01 DIAGNOSIS — M25561 Pain in right knee: Secondary | ICD-10-CM

## 2021-12-01 DIAGNOSIS — M1711 Unilateral primary osteoarthritis, right knee: Secondary | ICD-10-CM | POA: Diagnosis not present

## 2021-12-01 DIAGNOSIS — Z9889 Other specified postprocedural states: Secondary | ICD-10-CM

## 2021-12-01 NOTE — Patient Instructions (Addendum)
Good to see you  Tylenol [phone] mg 2-3 times a day for pain relief  Can use NSAIDS for breakthrough pain  Continue physical therapy  As needed follow up

## 2021-12-02 ENCOUNTER — Ambulatory Visit: Payer: 59 | Attending: Sports Medicine | Admitting: Rehabilitative and Restorative Service Providers"

## 2021-12-02 ENCOUNTER — Encounter: Payer: Self-pay | Admitting: Rehabilitative and Restorative Service Providers"

## 2021-12-02 DIAGNOSIS — R600 Localized edema: Secondary | ICD-10-CM | POA: Insufficient documentation

## 2021-12-02 DIAGNOSIS — M25561 Pain in right knee: Secondary | ICD-10-CM | POA: Insufficient documentation

## 2021-12-02 NOTE — Therapy (Signed)
OUTPATIENT PHYSICAL THERAPY TREATMENT NOTE   Patient Name: Jason Olsen MRN: [id number] DOB:12-01-1970, 51 y.o., male Today's Date: 12/02/2021  PCP: Binnie Rail, MD REFERRING PROVIDER: Glennon Mac, DO   PT End of Session - 12/02/21 1158     Visit Number 2    Number of Visits 16    Date for PT Re-Evaluation 01/20/22    PT Start Time 1120    PT Stop Time 1202    PT Time Calculation (min) 42 min    Activity Tolerance Patient tolerated treatment well;No increased pain    Behavior During Therapy [facility] for tasks assessed/performed             History reviewed. No pertinent past medical history. History reviewed. No pertinent surgical history. 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 Did treadmill on an incline and did the Elliptical too. Did Stair climber too. L knee was good while I was working  out but when I got into the cold it was painful.   PAIN:  Are you having pain? Yes NPRS scale: 4-5/10 Pain location: R knee Pain orientation: Left  PAIN TYPE: aching Pain description: intermittent  Aggravating factors: stepping out of truck Relieving factors: rest  PRECAUTIONS: None   WEIGHT BEARING RESTRICTIONS No  OBJECTIVE:              GENERAL OBSERVATION:                     Antalgic gait, reduced time in stance R, reduced step  length and height R   SENSATION:          Light touch: Appears intact   PALPATION: Diffuse pain anterior joint line   LE MMT:   MMT Right 11/25/2021 Left 11/25/2021  Hip flexion (L2, L3)      Knee extension (L3) 4* 4+  Knee flexion 4 4+  Hip abduction 4 4  Hip extension 4 4  Hip external rotation      Hip internal rotation      Hip adduction      Ankle dorsiflexion (L4)      Ankle plantarflexion (S1)      Ankle inversion      Ankle eversion      Great Toe ext (L5)        (Blank rows = not tested, score listed is out of 5 possible points.  N = WNL, D = diminished, C = clear for gross weakness with myotome testing, * = concordant pain with testing)   LE ROM:   ROM Right 11/25/2021 Left 11/25/2021  Hip flexion      Hip extension      Hip abduction      Hip adduction      Hip internal rotation      Hip external rotation      Knee flexion 110 125  Knee extension n  n    Ankle dorsiflexion      Ankle plantarflexion      Ankle inversion      Ankle eversion        (Blank rows = not tested, N = WNL, * = concordant pain with testing)   LOWER EXTREMITY SPECIAL TESTS:  Knee special tests: McMurray's test: positive  R (lateral)   GAIT: Comments: see general observation   TODAY'S TREATMENT: [facility] Adult PT Treatment:                                                DATE: 12/02/21 Therapeutic Exercise: EFK level 1 x 5 min 6 inch step ups lateral and front x 20 each with concentration on eccentric control Wall slides x 15 with glute set Airex: L hip abdct x 20, L hip ext x 20 while WB on R R SLR x 10 R quad set x 10 Reviewed clam with resistance Bridge with glute set x 15 Bridge with march x 20 Supine hamstring stretch x 30 sec each side Butterfly stretch bil 2x30 sec Standing hip flexor stretch x 30 sec each side    11/25/21:Creating, reviewing, and completing below HEP     PATIENT EDUCATION:  Reviewed HEP; pt reports he did not comply with it this past week. PT  advised him since he is traveling [place] thru Fri that he needs to make sure he is doing his exercises and trying to walk as much as possible. PT also advised for him to do a couple of LAQs before he gets out of the truck to loosen his knee up with PT demonstrating LAQ and pt reporting understanding.      HOME EXERCISE PROGRAM: Access Code: [email or web address] URL: [email or web address] Date: 11/25/2021 Prepared by: Alphonzo Severance   Exercises Active Straight Leg Raise with Quad Set - 1 x daily - 7 x weekly - 3 sets - 10 reps Supine Quad Set - 3 x daily - 7 x weekly - 2 sets - 10 reps - 10 second hold Clamshell with Resistance - 1 x daily - 7 x weekly - 3 sets - 10 reps     ASSESSMENT:   CLINICAL IMPRESSION: Jason Olsen is a 51 y.o. male who presents to clinic with signs and sxs consistent with R knee pain secondary to meniscus pathology.  Pt tolerated treatment well without pain. Prior to treatment, he had gone to [employer] for the first time. PT advised him to not perform [employer] prior to therapy and to work his way back into being in the gym instead of doing all of the cardio exercises he did on his first trip. Pt would benefit from PT for further R knee strengthening.      REHAB POTENTIAL: Good   CLINICAL DECISION MAKING: Stable/uncomplicated   EVALUATION COMPLEXITY: Low     GOALS:   SHORT TERM GOALS:   STG Name Target Date Goal status  1 Jason Olsen will be >75% HEP compliant to improve carryover between sessions and facilitate independent management of condition   Baseline: No HEP 12/16/2021 INITIAL    LONG TERM GOALS:    LTG Name Target Date Goal status  1 Jason Olsen will report >/= 50% decrease in pain from evaluation    Baseline: 9/10 max pain 01/20/2022 INITIAL  2 Jason Olsen will improve the following MMTs to >/= 5/5  to show improvement in strength:  knee ext, hip abd, hip ext    Baseline: see chart 01/20/2022 INITIAL  3 Jason Olsen will be independent with advance  HEP for indpendent management of condition 01/20/2022 INITIAL  4 Jason Olsen will achieve 120 degrees knee flexion by D/C (see POC end date) to improve ability to safely navigate steps in the community   Baseline: 110 degrees   01/20/2022 INITIAL    PLAN: PT FREQUENCY: 1-2x/week   PT DURATION: 8 weeks (Ending 01/20/2022)   PLANNED INTERVENTIONS: Therapeutic exercises, Therapeutic activity, Neuro Muscular re-education, Gait training, Patient/Family education, Joint mobilization, Dry Needling, Electrical stimulation, Spinal mobilization and/or manipulation, Moist heat, Taping, Vasopneumatic device, Ionotophoresis 4mg /ml Dexamethasone, and Manual therapy   PLAN FOR NEXT SESSION: progressive knee and hip strengthening         America Brown, PT 12/02/2021, 12:04 PM

## 2021-12-09 ENCOUNTER — Ambulatory Visit: Payer: 59 | Admitting: Physical Therapy

## 2021-12-16 ENCOUNTER — Encounter: Payer: Self-pay | Admitting: Physical Therapy

## 2021-12-16 ENCOUNTER — Other Ambulatory Visit: Payer: Self-pay

## 2021-12-16 ENCOUNTER — Ambulatory Visit: Payer: 59 | Admitting: Physical Therapy

## 2021-12-16 DIAGNOSIS — M25561 Pain in right knee: Secondary | ICD-10-CM

## 2021-12-16 DIAGNOSIS — R600 Localized edema: Secondary | ICD-10-CM

## 2021-12-16 NOTE — Therapy (Signed)
OUTPATIENT PHYSICAL THERAPY TREATMENT NOTE   Patient Name: Jason Olsen MRN: [id number] DOB:1971/03/14, 51 y.o., male Today's Date: 12/16/2021  PCP: Pincus Sanes, MD REFERRING PROVIDER: [name], DO   PT End of Session - 12/16/21 1001     Visit Number 3    Number of Visits 16    Date for PT Re-Evaluation 01/20/22    PT Start Time 1000    PT Stop Time 1043    PT Time Calculation (min) 43 min    Activity Tolerance Patient tolerated treatment well;No increased pain    Behavior During Therapy [facility] for tasks assessed/performed             History reviewed. No pertinent past medical history. History reviewed. No pertinent surgical history. 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    PAIN:  Are you having pain? Yes NPRS scale: 0/10 Pain location: R knee Pain orientation: Left  PAIN TYPE: aching Pain description: intermittent  Aggravating factors: stepping out of truck Relieving factors: rest  PRECAUTIONS: None   WEIGHT BEARING RESTRICTIONS No  OBJECTIVE:              GENERAL OBSERVATION:                     Antalgic gait, reduced time in stance R,  reduced step length and height R   SENSATION:          Light touch: Appears intact   PALPATION: Diffuse pain anterior joint line   LE MMT:   MMT Right 11/25/2021 Left 11/25/2021  Hip flexion (L2, L3)      Knee extension (L3) 4* 4+  Knee flexion 4 4+  Hip abduction 4 4  Hip extension 4 4  Hip external rotation      Hip internal rotation      Hip adduction      Ankle dorsiflexion (L4)      Ankle plantarflexion (S1)      Ankle inversion      Ankle eversion      Great Toe ext (L5)        (Blank rows = not tested, score listed is out of 5 possible points.  N = WNL, D = diminished, C = clear for gross weakness with myotome testing, * = concordant pain with testing)   LE ROM:   ROM Right 11/25/2021 Left 11/25/2021  Hip flexion      Hip extension      Hip abduction      Hip adduction      Hip internal rotation      Hip external rotation      Knee flexion 110 125  Knee extension n n    Ankle dorsiflexion      Ankle plantarflexion      Ankle inversion      Ankle eversion        (Blank rows = not tested, N = WNL, * = concordant pain with testing)   LOWER EXTREMITY SPECIAL TESTS:  Knee special tests: McMurray's test: positive  R (lateral)   GAIT: Comments: see general observation   TODAY'S TREATMENT: Treatment: 12/16/21 Therapeutic Exercise: Recumbent bike level 2 x 5 min Knee ext machine - 3x10 - knee ext machine - 25# R SLR 3 way x 10 S/L plank - with clam RTB - 3x10 S/L bridge - with contralateral SKTC - 3x10    NRE: SLS - 45'' x3 Wooden rocker board 2x20       HOME EXERCISE PROGRAM: Access Code: [email or web address] URL: [email or web address] Date: 11/25/2021 Prepared by: Alphonzo Severance   Exercises Active Straight Leg Raise with Quad Set - 1 x daily - 7 x weekly - 3 sets - 10 reps Supine Quad Set - 3 x daily - 7 x weekly - 2 sets - 10 reps - 10 second hold Clamshell with Resistance - 1 x daily - 7 x weekly - 3 sets - 10 reps     ASSESSMENT:    CLINICAL IMPRESSION: Jason Olsen is progressing well with therapy.  Pt reports a mild increase in pain following therapy.  Today we concentrated on quad strengthening and hip strengthening.  Pt progressing strength as expected.  He is having some trouble being HEP compliant d/t his work schedule/nature which is somewhat limiting.  Pt will continue to benefit from skilled physical therapy to address remaining deficits and achieve listed goals.  Continue per POC.     GOALS:   SHORT TERM GOALS:   STG Name Target Date Goal status  1 Jason Olsen will be >75% HEP compliant to improve carryover between sessions and facilitate independent management of condition   Baseline: No HEP 12/16/2021 INITIAL    LONG TERM GOALS:    LTG Name Target Date Goal status  1 Jason Olsen will report >/= 50% decrease in pain from evaluation    Baseline: 9/10 max pain 01/20/2022 INITIAL  2 Jason Olsen will improve the following MMTs to >/= 5/5 to show improvement in strength:  knee ext, hip abd, hip ext    Baseline: see chart 01/20/2022 INITIAL  3 Jason Olsen will be independent with advance HEP for indpendent management of condition 01/20/2022 INITIAL  4 Jason Olsen will achieve 120 degrees knee flexion by D/C (see POC end date) to improve ability to safely navigate steps in the community   Baseline: 110 degrees   01/20/2022 INITIAL    PLAN: PT FREQUENCY: 1-2x/week   PT DURATION: 8 weeks (Ending 01/20/2022)   PLANNED INTERVENTIONS: Therapeutic exercises, Therapeutic activity, Neuro Muscular re-education, Gait training, Patient/Family education, Joint mobilization, Dry Needling, Electrical stimulation, Spinal mobilization and/or manipulation, Moist heat, Taping, Vasopneumatic device, Ionotophoresis 4mg /ml Dexamethasone, and Manual therapy   PLAN FOR NEXT SESSION: progressive knee and hip strengthening         , PT 12/16/2021, 10:48 AM

## 2021-12-23 ENCOUNTER — Ambulatory Visit: Payer: 59 | Admitting: Physical Therapy

## 2021-12-28 ENCOUNTER — Other Ambulatory Visit: Payer: Self-pay

## 2021-12-28 ENCOUNTER — Ambulatory Visit: Payer: 59 | Attending: Sports Medicine | Admitting: Physical Therapy

## 2021-12-28 ENCOUNTER — Encounter: Payer: Self-pay | Admitting: Physical Therapy

## 2021-12-28 DIAGNOSIS — M25561 Pain in right knee: Secondary | ICD-10-CM | POA: Diagnosis not present

## 2021-12-28 DIAGNOSIS — R600 Localized edema: Secondary | ICD-10-CM | POA: Diagnosis present

## 2021-12-28 NOTE — Therapy (Signed)
?PHYSICAL THERAPY DISCHARGE SUMMARY ? ?Visits from Start of Care: 4 ? ?Current functional level related to goals / functional outcomes: ?See assessment/goals ?  ?Remaining deficits: ?See assessment/goals ?  ?Education / Equipment: ?HEP and D/C plans ? ?Patient agrees to discharge. Patient goals were met. Patient is being discharged due to meeting the stated rehab goals. ? ? ?Patient Name: Jason Olsen ?MRN: [id number] ?DOB:Aug 23, 1971, 51 y.o., male ?Today's Date: 12/28/2021 ? ?PCP: Binnie Rail, MD ?REFERRING PROVIDER: Glennon Mac, DO ? ? PT End of Session - 12/28/21 1529   ? ? Visit Number 4   ? Number of Visits 16   ? Date for PT Re-Evaluation 01/20/22   ? PT Start Time 0330   ? PT Stop Time [phone]   ? PT Time Calculation (min) 42 min   ? Activity Tolerance Patient tolerated treatment well;No increased pain   ? Behavior During Therapy [facility] for tasks assessed/performed   ? ?  ?  ? ?  ? ? ?History reviewed. No pertinent past medical history. ?History reviewed. No pertinent surgical history. ?Patient Active Problem List  ? Diagnosis Date Noted  ? Acute pain of right knee 11/03/2021  ? Hyperlipidemia 08/18/2021  ? Allergic rhinitis 08/18/2021  ? Asthma 08/18/2021  ? Hypertension 08/17/2021  ? Prediabetes 08/17/2021  ? 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 ? ?REFERRING DIAG: Referral diagnosis: Acute pain of right knee [M25.561], History of medial meniscus repair of right knee [Z98.890] ? ?REFERRING PROVIDER: Glennon Mac, DO ? ?THERAPY DIAG:  ?Right knee pain, unspecified chronicity ? ?Localized edema ? ?ONSET DATE: Early January 2023 ? ?PERTINENT HISTORY: Meniscal repair in 2013/2014 per patient, hypertension, donated right kidney in 2008/2009 ? ?SUBJECTIVE:  ? ?Pt reports that his knee was sore during cold weather.  He reports a 65% reduction in pain from eval. ? ?PAIN:  ?Are you having pain? Yes ?NPRS scale: 0/10 ?Pain location: R knee ?Pain orientation: Left  ?PAIN TYPE: aching ?Pain description:  intermittent  ?Aggravating factors: stepping out of truck ?Relieving factors: rest ? ?PRECAUTIONS: None ?  ?WEIGHT BEARING RESTRICTIONS No ? ?OBJECTIVE:  ?  ?          GENERAL OBSERVATION: ?                    Antalgic gait, reduced time in stance R, reduced step length and height R ?  ?SENSATION: ?         Light touch: Appears intact ?  ?PALPATION: ?Diffuse pain anterior joint line ?  ?LE MMT: ?  ?MMT Right ?11/25/2021 Left ?11/25/2021 R ?3/2   ?Hip flexion (L2, L3)        ?Knee extension (L3) 4* 4+ 5   ?Knee flexion 4 4+ 5   ?Hip abduction $RemoveBefore'4 4 5   'cOIvcXdzaAfiz$ ?Hip extension $RemoveBefore'4 4 5   'cFrduDiwyEWjv$ ?Hip external rotation        ?Hip internal rotation        ?Hip adduction        ?Ankle dorsiflexion (L4)        ?Ankle plantarflexion (S1)        ?Ankle inversion        ?Ankle eversion        ?Great Toe ext (L5)        ?  ?(Blank rows = not tested, score listed is out of 5 possible points.  N = WNL, D = diminished, C = clear for gross weakness with myotome testing, * =  concordant pain with testing) ?  ?LE ROM: ?  ?ROM Right ?11/25/2021 Left ?11/25/2021 3/2 ?Right  ?Hip flexion       ?Hip extension       ?Hip abduction       ?Hip adduction       ?Hip internal rotation       ?Hip external rotation       ?Knee flexion 110 125 120  ?Knee extension n n ?    ?Ankle dorsiflexion       ?Ankle plantarflexion       ?Ankle inversion       ?Ankle eversion       ?  ?(Blank rows = not tested, N = WNL, * = concordant pain with testing) ?  ?LOWER EXTREMITY SPECIAL TESTS:  ?Knee special tests: McMurray's test: positive  R (lateral) ?  ?GAIT: ?Comments: see general observation ?  ?TODAY'S TREATMENT: ?Treatment: 12/28/21 ?Therapeutic Exercise: ?Recumbent bike level 2 x 5 min ?Knee ext machine - 4x10 - knee ext machine - 25# ?R SLR 3 way x 10 2# ?S/L plank - with clam RTB - 3x10 ?S/L bridge - with contralateral SKTC - 3x10  ? ? NRE: ?SLS - 73'' x3 with rebounder throws ? ?  ?HOME EXERCISE PROGRAM: ?Access Code: [place] ?URL: [email or web address] ?Date:  11/25/2021 ?Prepared by: Shearon Balo ?  ?Exercises ?Active Straight Leg Raise with Quad Set - 1 x daily - 7 x weekly - 3 sets - 10 reps ?Supine Quad Set - 3 x daily - 7 x weekly - 2 sets - 10 reps - 10 second hold ?Clamshell with Resistance - 1 x daily - 7 x weekly - 3 sets - 10 reps ?  ?  ?ASSESSMENT: ?  ?CLINICAL IMPRESSION: ?Gilmer Kaminsky has progressed well with therapy.  Improved impairments include: balance, knee and hip strength.  Functional improvements include: ability to complete job and ambulate with reduced pain.  Progressions needed include: continued work at home with HEP.  Barriers to progress include: job which makes it difficult to complete HEP at times.  Please see GOALS section for progress on short term and long term goals established at evaluation.  I recommend D/C home with HEP; pt agrees with plan. ?  ?  ?GOALS: ?  ?SHORT TERM GOALS: ?  ?STG Name Target Date Goal status  ?1 Jamarri will be >75% HEP compliant to improve carryover between sessions and facilitate independent management of condition ?  ?Baseline: No HEP 12/16/2021 INITIAL  ?  ?LONG TERM GOALS:  ?  ?LTG Name Target Date Goal status  ?1 Tyrone will report >/= 50% decrease in pain from evaluation  ?  ?Baseline: 9/10 max pain ? ?3/2: 65% reduction in pain (self reported) 01/20/2022 MET  ?2 Tiyon will improve the following MMTs to >/= 5/5 to show improvement in strength:  knee ext, hip abd, hip ext  ?  ?Baseline: see chart ? ?3/2: MET 01/20/2022 MET  ?3 Alphonsus will be independent with advance HEP for indpendent management of condition ? ?3/2: MET 01/20/2022 MET  ?4 Rashied will achieve 120 degrees knee flexion by D/C (see POC end date) to improve ability to safely navigate steps in the community ?  ?Baseline: 110 degrees ? ?3/2: 120 degrees ?  01/20/2022 MET  ?  ?PLAN: ?PT FREQUENCY: 1-2x/week ?  ?PT DURATION: 8 weeks (Ending 01/20/2022) ?  ?PLANNED INTERVENTIONS: Therapeutic exercises, Therapeutic activity, Neuro Muscular re-education,  Gait training, Patient/Family education, Joint mobilization, Dry Needling, Electrical stimulation,  Spinal mobilization and/or manipulation, Moist heat, Taping, Vasopneumatic device, Ionotophoresis 4mg /ml Dexamethasone, and Manual therapy ?  ?PLAN FOR NEXT SESSION: progressive knee and hip strengthening ? ? ? ?Mathis Dad, PT ?12/28/2021, 4:24 PM ? ?   ?

## 2021-12-30 ENCOUNTER — Ambulatory Visit: Payer: 59 | Admitting: Physical Therapy

## 2022-02-09 ENCOUNTER — Ambulatory Visit: Payer: 59 | Admitting: Internal Medicine

## 2022-02-15 NOTE — Patient Instructions (Addendum)
? ? ?  [facility] for a colonoscopy  Phone: [phone] ? ? ? ?Blood work was ordered.   ? ? ? ?Medications changes include :   None ? ? ? ?A Ct scan for your heart was ordered. ? ? ? ?Return in about 6 months (around 08/18/2022) for Physical Exam. ? ?

## 2022-02-15 NOTE — Progress Notes (Signed)
? ? ? ? ?Subjective:  ? ? Patient ID: Jason Olsen, male    DOB: 02-23-1971, 51 y.o.   MRN: [id number] ? ?This visit occurred during the SARS-CoV-2 public health emergency.  Safety protocols were in place, including screening questions prior to the visit, additional usage of staff PPE, and extensive cleaning of exam room while observing appropriate contact time as indicated for disinfecting solutions.   ? ? ?HPI ?Jason Olsen is here for follow up of his chronic medical problems, including htn, hld, prediabetes, asthma, allergies ? ?He is not exercising regularly.  He has been eating a lot better.  He is drinking more black coffee.  Still drinks some soda.   ? ?Right knee is a little better.  ? ?Medications and allergies reviewed with patient and updated if appropriate. ? ?Current Outpatient Medications on File Prior to Visit  ?Medication Sig Dispense Refill  ? albuterol (VENTOLIN HFA) 108 (90 Base) MCG/ACT inhaler Inhale 1-2 puffs into the lungs every 4 (four) hours as needed for wheezing or shortness of breath. 6.7 g 8  ? amLODipine (NORVASC) 5 MG tablet Take 1 tablet (5 mg total) by mouth daily. 90 tablet 2  ? atorvastatin (LIPITOR) 40 MG tablet Take 40 mg by mouth at bedtime.    ? B Complex-C (B-COMPLEX WITH VITAMIN C) tablet Take by mouth.    ? cetirizine (ZYRTEC) 10 MG tablet Take 1 tablet (10 mg total) by mouth daily. 30 tablet 11  ? fluticasone (FLONASE) 50 MCG/ACT nasal spray 2 Sprays by each nostril route Once a Day.    ? lisinopril (ZESTRIL) 20 MG tablet Take 1 tablet (20 mg total) by mouth daily. 90 tablet 1  ? montelukast (SINGULAIR) 10 MG tablet Take 1 tablet (10 mg total) by mouth at bedtime. 90 tablet 2  ? tadalafil (CIALIS) 10 MG tablet Take 10 mg by mouth as needed.    ? ?No current facility-administered medications on file prior to visit.  ? ? ? ?Review of Systems  ?Constitutional:  Negative for fever.  ?HENT:  Positive for congestion (mild - occ, yellow-green at times).   ?Respiratory:  Negative for  cough, shortness of breath and wheezing.   ?Cardiovascular:  Positive for leg swelling (mild at time). Negative for chest pain and palpitations.  ?Neurological:  Negative for light-headedness and headaches.  ? ?   ?Objective:  ? ?Vitals:  ? 02/16/22 0814  ?BP: 132/80  ?Pulse: 79  ?Temp: 98.1 ?F (36.7 ?C)  ?SpO2: 98%  ? ?BP Readings from Last 3 Encounters:  ?02/16/22 132/80  ?12/01/21 132/90  ?11/03/21 140/90  ? ?Wt Readings from Last 3 Encounters:  ?02/16/22 218 lb 12.8 oz (99.2 kg)  ?12/01/21 220 lb (99.8 kg)  ?11/03/21 219 lb (99.3 kg)  ? ?Body mass index is 30.52 kg/m?. ? ?  ?Physical Exam ?Constitutional:   ?   General: He is not in acute distress. ?   Appearance: Normal appearance. He is not ill-appearing.  ?HENT:  ?   Head: Normocephalic and atraumatic.  ?Eyes:  ?   Conjunctiva/sclera: Conjunctivae normal.  ?Cardiovascular:  ?   Rate and Rhythm: Normal rate and regular rhythm.  ?   Heart sounds: Normal heart sounds. No murmur heard. ?Pulmonary:  ?   Effort: Pulmonary effort is normal. No respiratory distress.  ?   Breath sounds: Normal breath sounds. No wheezing or rales.  ?Musculoskeletal:  ?   Right lower leg: No edema.  ?   Left lower leg: No edema.  ?Skin: ?  General: Skin is warm and dry.  ?   Findings: No rash.  ?Neurological:  ?   Mental Status: He is alert. Mental status is at baseline.  ?Psychiatric:     ?   Mood and Affect: Mood normal.  ? ?   ? ?Lab Results  ?Component Value Date  ? WBC 4.5 08/18/2021  ? HGB 14.7 08/18/2021  ? HCT 43.8 08/18/2021  ? PLT 205.0 08/18/2021  ? GLUCOSE 107 (H) 08/18/2021  ? CHOL 183 08/18/2021  ? TRIG 61.0 08/18/2021  ? HDL 49.40 08/18/2021  ? LDLCALC 121 (H) 08/18/2021  ? ALT 33 08/18/2021  ? AST 27 08/18/2021  ? NA 140 08/18/2021  ? K 4.2 08/18/2021  ? CL 103 08/18/2021  ? CREATININE 1.32 08/18/2021  ? BUN 15 08/18/2021  ? CO2 31 08/18/2021  ? TSH 1.06 08/18/2021  ? HGBA1C 6.6 (H) 08/18/2021  ? ? ? ?Assessment & Plan:  ? ? ?See Problem List for Assessment and Plan of  chronic medical problems.  ? ? ?

## 2022-02-16 ENCOUNTER — Encounter: Payer: Self-pay | Admitting: Internal Medicine

## 2022-02-16 ENCOUNTER — Ambulatory Visit: Payer: 59 | Admitting: Internal Medicine

## 2022-02-16 ENCOUNTER — Ambulatory Visit (INDEPENDENT_AMBULATORY_CARE_PROVIDER_SITE_OTHER): Payer: 59 | Admitting: Internal Medicine

## 2022-02-16 VITALS — BP 132/80 | HR 79 | Temp 98.1°F | Ht 71.0 in | Wt 218.8 lb

## 2022-02-16 DIAGNOSIS — R7303 Prediabetes: Secondary | ICD-10-CM | POA: Diagnosis not present

## 2022-02-16 DIAGNOSIS — J45909 Unspecified asthma, uncomplicated: Secondary | ICD-10-CM

## 2022-02-16 DIAGNOSIS — Z136 Encounter for screening for cardiovascular disorders: Secondary | ICD-10-CM

## 2022-02-16 DIAGNOSIS — E782 Mixed hyperlipidemia: Secondary | ICD-10-CM

## 2022-02-16 DIAGNOSIS — I10 Essential (primary) hypertension: Secondary | ICD-10-CM | POA: Diagnosis not present

## 2022-02-16 DIAGNOSIS — J309 Allergic rhinitis, unspecified: Secondary | ICD-10-CM

## 2022-02-16 LAB — COMPREHENSIVE METABOLIC PANEL
ALT: 20 U/L (ref 0–53)
AST: 22 U/L (ref 0–37)
Albumin: 4.1 g/dL (ref 3.5–5.2)
Alkaline Phosphatase: 65 U/L (ref 39–117)
BUN: 11 mg/dL (ref 6–23)
CO2: 28 mEq/L (ref 19–32)
Calcium: 8.7 mg/dL (ref 8.4–10.5)
Chloride: 105 mEq/L (ref 96–112)
Creatinine, Ser: 1.27 mg/dL (ref 0.40–1.50)
GFR: 66.57 mL/min (ref >60.00)
Glucose, Bld: 103 mg/dL — ABNORMAL HIGH (ref 70–99)
Potassium: 3.6 mEq/L (ref 3.5–5.1)
Sodium: 141 mEq/L (ref 135–145)
Total Bilirubin: 0.6 mg/dL (ref 0.2–1.2)
Total Protein: 7.1 g/dL (ref 6.0–8.3)

## 2022-02-16 LAB — LIPID PANEL
Cholesterol: 202 mg/dL — ABNORMAL HIGH (ref 0–200)
HDL: 44.9 mg/dL (ref >39.00)
LDL Cholesterol: 134 mg/dL — ABNORMAL HIGH (ref 0–99)
NonHDL: 156.6
Total CHOL/HDL Ratio: 4
Triglycerides: 113 mg/dL (ref 0.0–149.0)
VLDL: 22.6 mg/dL (ref 0.0–40.0)

## 2022-02-16 LAB — HEMOGLOBIN A1C: Hgb A1c MFr Bld: 6.7 % — ABNORMAL HIGH (ref 4.6–6.5)

## 2022-02-16 MED ORDER — ALBUTEROL SULFATE HFA 108 (90 BASE) MCG/ACT IN AERS: 1.0000 | INHALATION_SPRAY | RESPIRATORY_TRACT | 8 refills | Status: DC | PRN

## 2022-02-16 NOTE — Assessment & Plan Note (Signed)
Chronic Blood pressure well controlled CMP Continue amlodipine 5 mg daily, lisinopril 20 mg daily 

## 2022-02-16 NOTE — Assessment & Plan Note (Signed)
Chronic Check a1c Low sugar / carb diet Stressed regular exercise  

## 2022-02-16 NOTE — Assessment & Plan Note (Signed)
Chronic ?Exercise-induced ?Controlled ?Continue albuterol inhaler as needed ?

## 2022-02-16 NOTE — Assessment & Plan Note (Signed)
Chronic Regular exercise and healthy diet encouraged Check lipid panel  Continue atorvastatin 40 mg daily 

## 2022-02-16 NOTE — Assessment & Plan Note (Signed)
Chronic Controlled Continue Zyrtec 10 mg daily, montelukast 10 mg nightly 

## 2022-02-18 ENCOUNTER — Encounter: Payer: Self-pay | Admitting: Internal Medicine

## 2022-02-19 ENCOUNTER — Telehealth: Payer: Self-pay | Admitting: Internal Medicine

## 2022-02-19 MED ORDER — PROAIR HFA 108 (90 BASE) MCG/ACT IN AERS: 1.0000 | INHALATION_SPRAY | Freq: Four times a day (QID) | RESPIRATORY_TRACT | 11 refills | Status: AC | PRN

## 2022-02-19 NOTE — Telephone Encounter (Signed)
Mary from Pharmacy called and states that pt insurance will not cover albuterol (VENTOLIN HFA) 108 (90 Base) MCG/ACT inhaler but will cover Pro- Air inhaler, and wanted to make sure it is okay to change rx.  ?

## 2022-04-02 ENCOUNTER — Ambulatory Visit
Admission: RE | Admit: 2022-04-02 | Discharge: 2022-04-02 | Disposition: A | Discharge status: Home / Self Care | Payer: Self-pay | Source: Ambulatory Visit | Attending: Internal Medicine | Admitting: Internal Medicine

## 2022-04-02 DIAGNOSIS — Z136 Encounter for screening for cardiovascular disorders: Secondary | ICD-10-CM

## 2022-04-05 ENCOUNTER — Encounter: Payer: Self-pay | Admitting: Internal Medicine

## 2022-04-05 DIAGNOSIS — R931 Abnormal findings on diagnostic imaging of heart and coronary circulation: Secondary | ICD-10-CM | POA: Insufficient documentation

## 2022-04-06 ENCOUNTER — Other Ambulatory Visit: Payer: 59

## 2022-04-23 ENCOUNTER — Telehealth: Payer: Self-pay

## 2022-04-23 MED ORDER — DEXCOM G7 SENSOR MISC
5 refills | Status: DC
Start: 2022-04-23 — End: 2022-12-17

## 2022-04-23 MED ORDER — DEXCOM G7 RECEIVER DEVI: 0 refills | Status: DC

## 2022-04-24 NOTE — Telephone Encounter (Signed)
Called and let pt know the Rx requested was sent to pharmacy

## 2022-05-23 ENCOUNTER — Other Ambulatory Visit: Payer: Self-pay | Admitting: Internal Medicine

## 2022-05-30 ENCOUNTER — Other Ambulatory Visit: Payer: Self-pay

## 2022-05-30 MED ORDER — MONTELUKAST SODIUM 10 MG PO TABS
10.0000 mg | ORAL_TABLET | Freq: Every day | ORAL | 2 refills | Status: DC
Start: 2022-05-30 — End: 2023-01-16

## 2022-05-30 MED ORDER — LISINOPRIL 20 MG PO TABS: 20.0000 mg | ORAL_TABLET | Freq: Every day | ORAL | 0 refills | Status: DC

## 2022-05-30 MED ORDER — AMLODIPINE BESYLATE 5 MG PO TABS: 5.0000 mg | ORAL_TABLET | Freq: Every day | ORAL | 2 refills | Status: DC

## 2022-06-21 ENCOUNTER — Other Ambulatory Visit: Payer: Self-pay | Admitting: Internal Medicine

## 2022-07-13 ENCOUNTER — Emergency Department (HOSPITAL_BASED_OUTPATIENT_CLINIC_OR_DEPARTMENT_OTHER)
Admission: EM | Admit: 2022-07-13 | Discharge: 2022-07-13 | Disposition: A | Discharge status: Home / Self Care | Payer: BC Managed Care – PPO | Attending: Emergency Medicine | Admitting: Emergency Medicine

## 2022-07-13 ENCOUNTER — Encounter (HOSPITAL_BASED_OUTPATIENT_CLINIC_OR_DEPARTMENT_OTHER): Payer: Self-pay | Admitting: Emergency Medicine

## 2022-07-13 ENCOUNTER — Other Ambulatory Visit: Payer: Self-pay

## 2022-07-13 DIAGNOSIS — R0981 Nasal congestion: Secondary | ICD-10-CM | POA: Diagnosis not present

## 2022-07-13 DIAGNOSIS — H9202 Otalgia, left ear: Secondary | ICD-10-CM | POA: Insufficient documentation

## 2022-07-13 HISTORY — DX: Essential (primary) hypertension: I10

## 2022-07-13 MED ORDER — AMOXICILLIN 500 MG PO CAPS: 500.0000 mg | ORAL_CAPSULE | Freq: Three times a day (TID) | ORAL | 0 refills | Status: DC

## 2022-07-13 NOTE — ED Triage Notes (Signed)
Pt left sided ear pain since monday

## 2022-07-14 NOTE — ED Provider Notes (Signed)
[facility] EMERGENCY DEPT Provider Note   CSN: [id number] Arrival date & time: 07/13/22  2059     History  Chief Complaint  Patient presents with   Otalgia    Jason Olsen is a 51 y.o. male.  The history is provided by the patient.  Otalgia Location:  Left Quality:  Aching Severity:  Moderate Onset quality:  Gradual Duration:  5 days Timing:  Constant Progression:  Worsening Chronicity:  New Context: elevation change   Context: not direct blow and not water in ear   Relieved by:  Nothing Associated symptoms: congestion and ear discharge   Associated symptoms: no fever, no hearing loss and no tinnitus   Patient reports recent travel through elevation changes had popping and pain in his left ear.  He also reports clear drainage, no bloody discharge.  No trauma.  No previous ear surgery.  He does report recent upper respiratory infection and was on Keflex recently He has not placed anything in his ears     Home Medications Prior to Admission medications   Medication Sig Start Date End Date Taking? Authorizing Provider  amoxicillin (AMOXIL) 500 MG capsule Take 1 capsule (500 mg total) by mouth 3 (three) times daily. 07/13/22  Yes Ripley Fraise, MD  amLODipine (NORVASC) 5 MG tablet Take 1 tablet (5 mg total) by mouth daily. 05/30/22   Binnie Rail, MD  atorvastatin (LIPITOR) 40 MG tablet Take 40 mg by mouth at bedtime. 04/25/21   [provider]  B Complex-C (B-COMPLEX WITH VITAMIN C) tablet Take by mouth.    [provider]  cetirizine (ZYRTEC) 10 MG tablet Take 1 tablet (10 mg total) by mouth daily. 08/18/21   Binnie Rail, MD  Continuous Blood Gluc Receiver (DEXCOM G7 RECEIVER) DEVI USE AS DIRECTED TO CHECK SUGARS 06/22/22   Burns, Claudina Lick, MD  Continuous Blood Gluc Sensor (DEXCOM G7 SENSOR) MISC UAD to check sugars   dx E11.9 04/23/22   Binnie Rail, MD  fluticasone (FLONASE) 50 MCG/ACT nasal spray 2 Sprays by each nostril route Once a  Day. 01/20/14   [provider]  lisinopril (ZESTRIL) 20 MG tablet Take 1 tablet (20 mg total) by mouth daily. 05/30/22   Binnie Rail, MD  montelukast (SINGULAIR) 10 MG tablet Take 1 tablet (10 mg total) by mouth at bedtime. 05/30/22   Binnie Rail, MD  PROAIR HFA [id number] Base) MCG/ACT inhaler Inhale 1-2 puffs into the lungs every 6 (six) hours as needed for wheezing or shortness of breath. 02/19/22   Binnie Rail, MD  tadalafil (CIALIS) 10 MG tablet Take 10 mg by mouth as needed. 06/02/21   [provider]      Allergies    Penicillins and Sulfa antibiotics    Review of Systems   Review of Systems  Constitutional:  Negative for fever.  HENT:  Positive for congestion, ear discharge and ear pain. Negative for hearing loss and tinnitus.     Physical Exam Updated Vital Signs BP (!) 150/100   Pulse 66   Temp (!) 97 F (36.1 C)   Resp 18   Ht 1.803 m (5\' 11" )   Wt 97.1 kg   SpO2 98%   BMI 29.85 kg/m  Physical Exam CONSTITUTIONAL: Well developed/well nourished HEAD: Normocephalic/atraumatic EYES: EOMI/PERRL ENMT: Mucous membranes moist Right TM occluded by cerumen Left  TM reveals ?  Ruptured TM.  No blood noted, small amount of drainage noted.  No mastoid tenderness or  bogginess.  Ears are symmetric NECK: supple no meningeal signs NEURO: Pt is awake/alert/appropriate, moves all extremitiesx4.  No facial droop.   EXTREMITIES:  full ROM SKIN: warm, color normal PSYCH: no abnormalities of mood noted, alert and oriented to situation  ED Results / Procedures / Treatments   Labs (all labs ordered are listed, but only abnormal results are displayed) Labs Reviewed - No data to display  EKG None  Radiology No results found.  Procedures Procedures    Medications Ordered in ED Medications - No data to display  ED Course/ Medical Decision Making/ A&P                           Medical Decision Making Risk Prescription drug management.   Difficult to  completely visualize the TM, but I suspect perforation Otherwise exam was unremarkable.  Patient is in no distress.  Patient has  a history of hypertension but no signs of hypertensive crisis Plan to start antibiotics, refer to ENT.  He needs exam in next week. Advised to not place anything in his ear, and not get his ear wet       Final Clinical Impression(s) / ED Diagnoses Final diagnoses:  Left ear pain    Rx / DC Orders ED Discharge Orders          Ordered    amoxicillin (AMOXIL) 500 MG capsule  3 times daily        07/13/22 2339              Zadie Rhine, MD 07/14/22 0202

## 2022-07-16 DIAGNOSIS — H6690 Otitis media, unspecified, unspecified ear: Secondary | ICD-10-CM | POA: Insufficient documentation

## 2022-07-16 NOTE — Progress Notes (Unsigned)
    Subjective:    Patient ID: Denilson Salminen, male    DOB: Sep 18, 1971, 51 y.o.   MRN: [id number]      HPI Olive is here for No chief complaint on file.    Ear infection: He he has not had some congestion and ear discharge.  Went to the ED 9/15 for left ear pain.  It was ongoing for 5 days and getting worse.  In the ED it was difficult to visualize that time, but perforation was suspected.  He was started on amoxicillin 500 mg 3 times daily and was referred to ENT.  He is here for follow-up.      Medications and allergies reviewed with patient and updated if appropriate.  Current Outpatient Medications on File Prior to Visit  Medication Sig Dispense Refill   amLODipine (NORVASC) 5 MG tablet Take 1 tablet (5 mg total) by mouth daily. 90 tablet 2   amoxicillin (AMOXIL) 500 MG capsule Take 1 capsule (500 mg total) by mouth 3 (three) times daily. 21 capsule 0   atorvastatin (LIPITOR) 40 MG tablet Take 40 mg by mouth at bedtime.     B Complex-C (B-COMPLEX WITH VITAMIN C) tablet Take by mouth.     cetirizine (ZYRTEC) 10 MG tablet Take 1 tablet (10 mg total) by mouth daily. 30 tablet 11   Continuous Blood Gluc Receiver (DEXCOM G7 RECEIVER) DEVI USE AS DIRECTED TO CHECK SUGARS 1 each 0   Continuous Blood Gluc Sensor (DEXCOM G7 SENSOR) MISC UAD to check sugars   dx E11.9 3 each 5   fluticasone (FLONASE) 50 MCG/ACT nasal spray 2 Sprays by each nostril route Once a Day.     lisinopril (ZESTRIL) 20 MG tablet Take 1 tablet (20 mg total) by mouth daily. 90 tablet 0   montelukast (SINGULAIR) 10 MG tablet Take 1 tablet (10 mg total) by mouth at bedtime. 90 tablet 2   PROAIR HFA 108 (90 Base) MCG/ACT inhaler Inhale 1-2 puffs into the lungs every 6 (six) hours as needed for wheezing or shortness of breath. 6.7 g 11   tadalafil (CIALIS) 10 MG tablet Take 10 mg by mouth as needed.     No current facility-administered medications on file prior to visit.    Review of Systems     Objective:  There  were no vitals filed for this visit. BP Readings from Last 3 Encounters:  07/13/22 (!) 150/100  02/16/22 132/80  12/01/21 132/90   Wt Readings from Last 3 Encounters:  07/13/22 214 lb (97.1 kg)  02/16/22 218 lb 12.8 oz (99.2 kg)  12/01/21 220 lb (99.8 kg)   There is no height or weight on file to calculate BMI.    Physical Exam         Assessment & Plan:    See Problem List for Assessment and Plan of chronic medical problems.

## 2022-07-17 ENCOUNTER — Encounter: Payer: Self-pay | Admitting: Internal Medicine

## 2022-07-17 ENCOUNTER — Ambulatory Visit (INDEPENDENT_AMBULATORY_CARE_PROVIDER_SITE_OTHER): Payer: BC Managed Care – PPO | Admitting: Internal Medicine

## 2022-07-17 VITALS — BP 136/96 | HR 86 | Temp 98.3°F | Ht 71.0 in | Wt 215.4 lb

## 2022-07-17 DIAGNOSIS — E782 Mixed hyperlipidemia: Secondary | ICD-10-CM

## 2022-07-17 DIAGNOSIS — E119 Type 2 diabetes mellitus without complications: Secondary | ICD-10-CM | POA: Diagnosis not present

## 2022-07-17 DIAGNOSIS — I10 Essential (primary) hypertension: Secondary | ICD-10-CM | POA: Diagnosis not present

## 2022-07-17 DIAGNOSIS — H6692 Otitis media, unspecified, left ear: Secondary | ICD-10-CM

## 2022-07-17 LAB — LIPID PANEL
Cholesterol: 249 mg/dL — ABNORMAL HIGH (ref 0–200)
HDL: 50.2 mg/dL (ref >39.00)
LDL Cholesterol: 181 mg/dL — ABNORMAL HIGH (ref 0–99)
NonHDL: 198.59
Total CHOL/HDL Ratio: 5
Triglycerides: 86 mg/dL (ref 0.0–149.0)
VLDL: 17.2 mg/dL (ref 0.0–40.0)

## 2022-07-17 LAB — COMPREHENSIVE METABOLIC PANEL
ALT: 28 U/L (ref 0–53)
AST: 23 U/L (ref 0–37)
Albumin: 4.3 g/dL (ref 3.5–5.2)
Alkaline Phosphatase: 86 U/L (ref 39–117)
BUN: 16 mg/dL (ref 6–23)
CO2: 27 mEq/L (ref 19–32)
Calcium: 9.2 mg/dL (ref 8.4–10.5)
Chloride: 104 mEq/L (ref 96–112)
Creatinine, Ser: 1.24 mg/dL (ref 0.40–1.50)
GFR: 68.31 mL/min (ref >60.00)
Glucose, Bld: 128 mg/dL — ABNORMAL HIGH (ref 70–99)
Potassium: 3.7 mEq/L (ref 3.5–5.1)
Sodium: 139 mEq/L (ref 135–145)
Total Bilirubin: 0.6 mg/dL (ref 0.2–1.2)
Total Protein: 7.8 g/dL (ref 6.0–8.3)

## 2022-07-17 LAB — MICROALBUMIN / CREATININE URINE RATIO
Creatinine,U: 281.9 mg/dL
Microalb Creat Ratio: 0.8 mg/g (ref 0.0–30.0)
Microalb, Ur: 2.3 mg/dL — ABNORMAL HIGH (ref 0.0–1.9)

## 2022-07-17 LAB — HEMOGLOBIN A1C: Hgb A1c MFr Bld: 7.3 % — ABNORMAL HIGH (ref 4.6–6.5)

## 2022-07-17 MED ORDER — ATORVASTATIN CALCIUM 40 MG PO TABS: 40.0000 mg | ORAL_TABLET | Freq: Every day | ORAL | 2 refills | Status: DC

## 2022-07-17 MED ORDER — AMLODIPINE BESYLATE 10 MG PO TABS: 10.0000 mg | ORAL_TABLET | Freq: Every day | ORAL | 2 refills | Status: DC

## 2022-07-17 NOTE — Assessment & Plan Note (Signed)
Chronic BP not controlled Continue lisinopril 20 mg daily, increase amlodipine to 10 mg daily cmp

## 2022-07-17 NOTE — Assessment & Plan Note (Signed)
Chronic Lifestyle controlled right now He is monitoring his sugars at home and they are variable depending on how good he is Stressed diabetic diet, exercise Check A1c, urine microalbumin

## 2022-07-17 NOTE — Patient Instructions (Addendum)
     Blood work was ordered.     Medications changes include :  increase amlodipine to 10 mg dialy    Your prescription(s) have been sent to your pharmacy.      Return in about 6 months (around 01/15/2023) for Physical Exam.

## 2022-07-17 NOTE — Assessment & Plan Note (Signed)
Acute Symptoms started several days ago-diagnosed with otitis media when he went to the ED 9/15 On amoxicillin-symptoms are improving Eardrum still looks infected-loss of structures, but no obvious perforation Complete full course of antibiotics If symptoms have not completely resolved advised for him to come back to have this rechecked

## 2022-07-17 NOTE — Assessment & Plan Note (Addendum)
Chronic Ran out of atorvastatin - was not sure he was post to continue it Sent new prescription to pharmacy-advised he needs to take it daily Check lipid panel, but no this will be high since he has not been on medication

## 2022-09-06 ENCOUNTER — Other Ambulatory Visit: Payer: Self-pay

## 2022-09-06 ENCOUNTER — Telehealth: Payer: Self-pay | Admitting: Internal Medicine

## 2022-09-06 MED ORDER — TADALAFIL 10 MG PO TABS: 10.0000 mg | ORAL_TABLET | ORAL | 3 refills | Status: DC | PRN

## 2022-09-06 NOTE — Telephone Encounter (Signed)
Sent in today 

## 2022-09-06 NOTE — Telephone Encounter (Signed)
Caller Name: Jason Olsen Call back phone #: [phone]   MEDICATION(S):  tadalafil (CIALIS) 10 MG tablet    Has the patient contacted their pharmacy (YES/NO)? Yes What did pharmacy advise? Advised to contact us  Preferred Pharmacy:  [facility] [address], [place] - [address] [place] [address] Phone: [phone]  Fax: [phone]    ~~~Please advise patient/caregiver to allow 2-3 business days to process RX refills.

## 2022-11-02 DIAGNOSIS — Z6828 Body mass index (BMI) 28.0-28.9, adult: Secondary | ICD-10-CM | POA: Diagnosis not present

## 2022-11-02 DIAGNOSIS — Z20822 Contact with and (suspected) exposure to covid-19: Secondary | ICD-10-CM | POA: Diagnosis not present

## 2022-11-02 DIAGNOSIS — I10 Essential (primary) hypertension: Secondary | ICD-10-CM | POA: Diagnosis not present

## 2022-11-02 DIAGNOSIS — U071 COVID-19: Secondary | ICD-10-CM | POA: Diagnosis not present

## 2022-11-16 ENCOUNTER — Other Ambulatory Visit: Payer: Self-pay | Admitting: Internal Medicine

## 2022-12-14 IMAGING — CT CT CARDIAC CORONARY ARTERY CALCIUM SCORE
3 series · 14 of 20 positions shown, 16 images · non-contrast
Comparison: None Available.
COMPARISON: None Available.

Addendum:
EXAM:
OVER-READ INTERPRETATION  CT CHEST

The following report is a limited chest CT over-read performed by
This over-read does not include interpretation of cardiac or
coronary anatomy or pathology. The coronary calcium interpretation
by the cardiologist is attached.
CLINICAL DATA: Cardiovascular disease risk stratification
CAD screening, intermediate CAD risk, treadmill candidate
CT Coronary Calcium Score
TECHNIQUE: A gated, non-contrast computed tomography scan of the heart was
performed using 3mm slice thickness. Axial images were analyzed on a
dedicated workstation. Calcium scoring of the coronary arteries was
performed using the Agatston method.

[Series 2: cascseq 2.0 sa36 (id) (id) · axial · 0.39mm/px · z∈[-269,-179]mm · 4 of 76 slices shown]
[im 16/76  vessel]
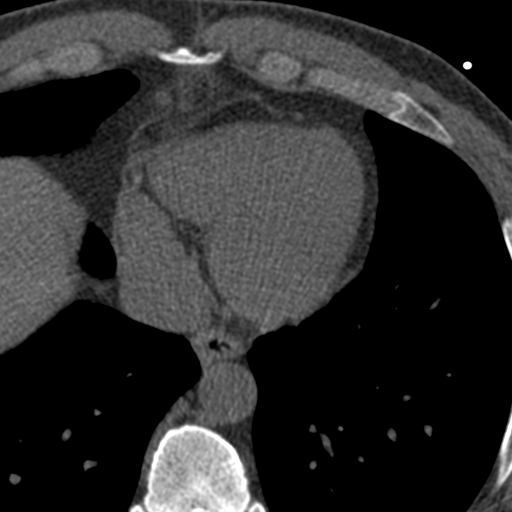
[im 31/76  vessel]
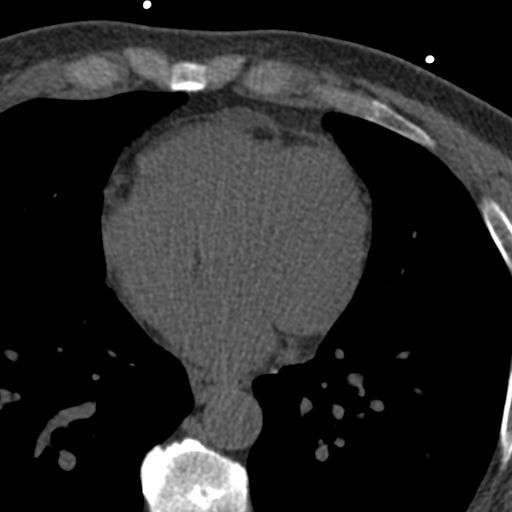
[im 46/76  vessel]
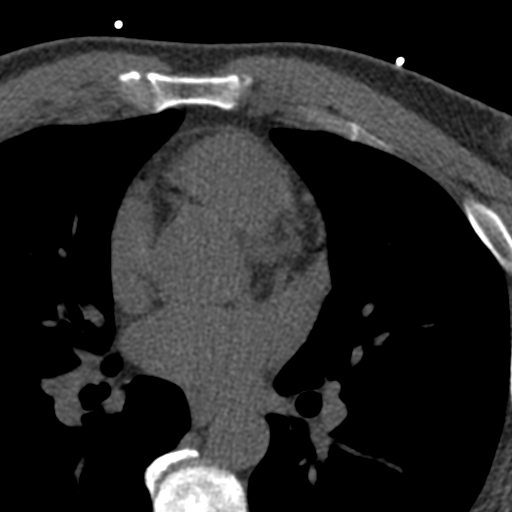
[im 61/76  vessel]
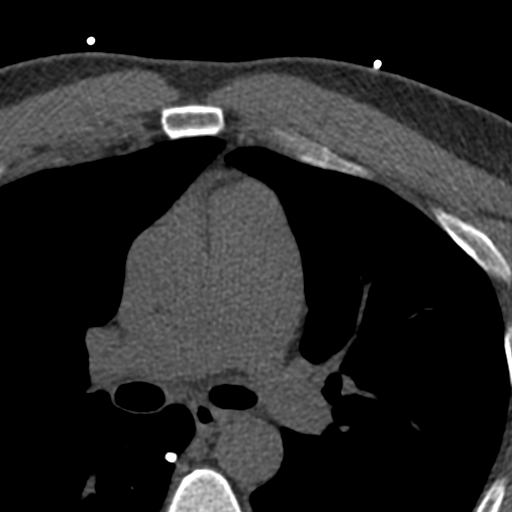

[Series 3: cascseq 2.0 bf37 st · axial · 0.72mm/px · z∈[-275,-175]mm · 5 of 76 slices shown, 7 images]
[im 13/76  vessel]
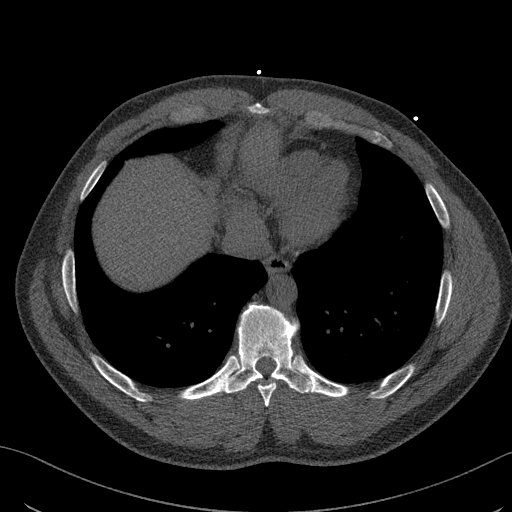
[im 13/76  lung]
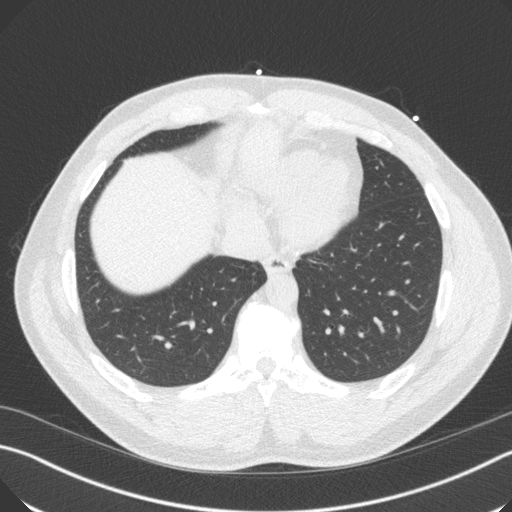
[im 26/76  vessel]
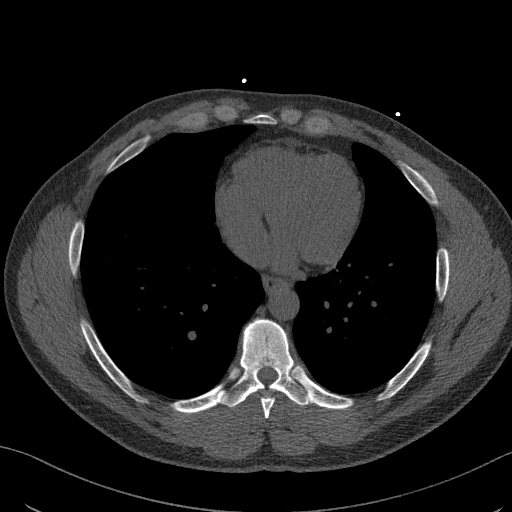
[im 38/76  vessel]
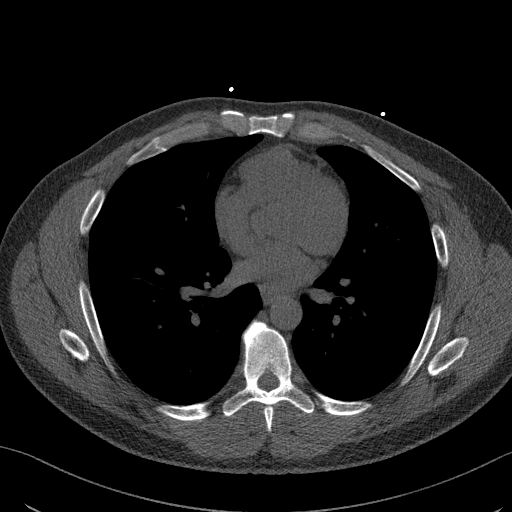
[im 51/76  vessel]
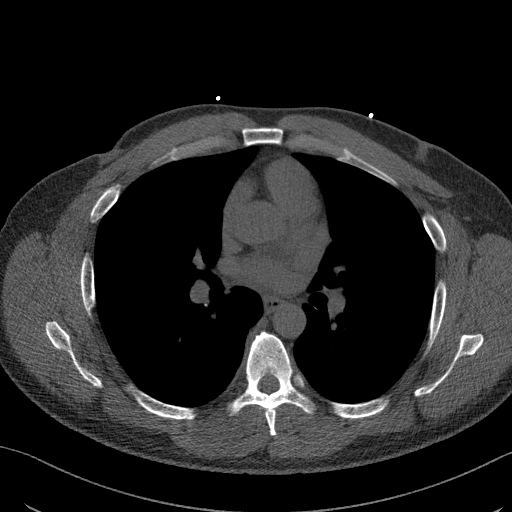
[im 63/76  vessel]
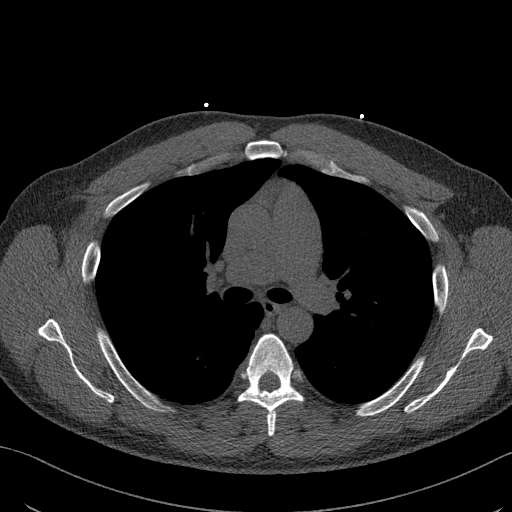
[im 63/76  lung]
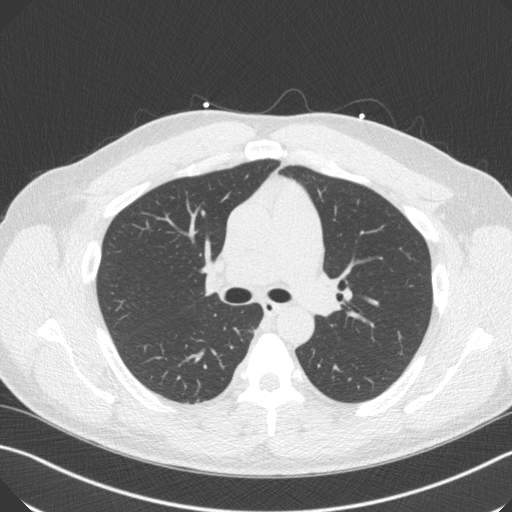

[Series 4: cascseq 2.0 br59 lung · axial · 0.72mm/px · z∈[-275,-175]mm · 5 of 76 slices shown]
[im 13/76  lung]
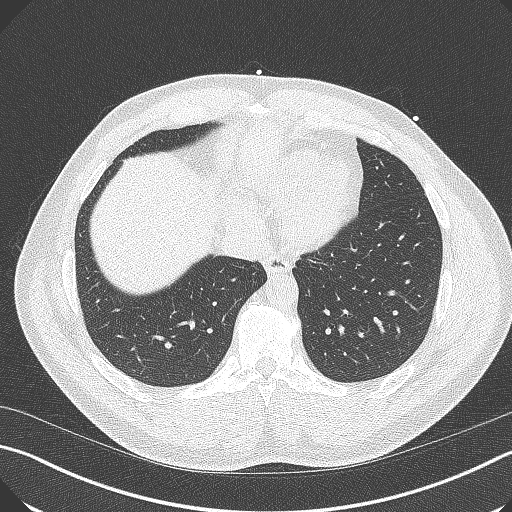
[im 26/76  lung]
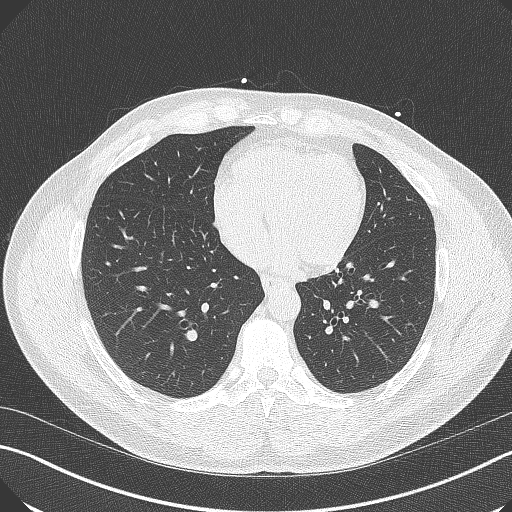
[im 38/76  lung]
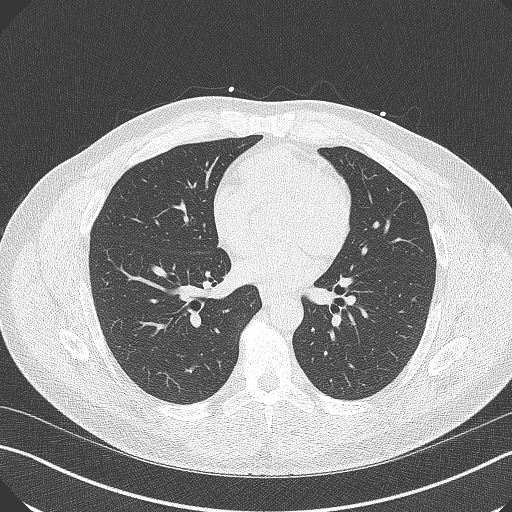
[im 51/76  lung]
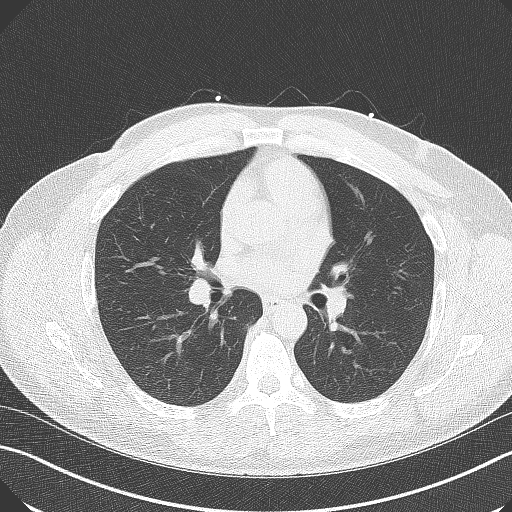
[im 63/76  lung]
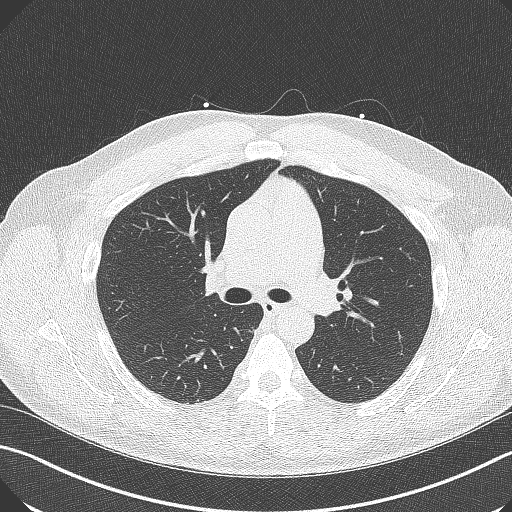

[14 of 20 positions shown; findings below may reference images not displayed]

FINDINGS: Vascular: Normal caliber of the visualized thoracic aorta.

Mediastinum/Nodes: Visualized mediastinal structures are normal.

Lungs/Pleura: Subtle 3 mm nodule in the right middle lobe near the
right minor fissure on sequence 4 image 19. Peripheral calcified
granuloma in the medial right lower lobe measures 4 mm on sequence 4
image 17. No large areas of airspace disease or consolidation in the
visualized lungs. No large pleural effusions.

Upper Abdomen: Images of the upper abdomen are unremarkable.

Musculoskeletal: Bridging osteophytes in the visualized thoracic
spine.
IMPRESSION: 1. No acute extracardiac findings.
2. Subtle 3 mm nodule in the right middle lobe. No routine follow-up
imaging is recommended per [HOSPITAL] Guidelines.
These guidelines do not apply to immunocompromised patients and
patients with cancer. Follow up in patients with significant
comorbidities as clinically warranted. For lung cancer screening,
adhere to Lung-RADS guidelines. Reference: Radiology. 5328;
FINDINGS: Coronary Calcium Score:

Left main: 0

Left anterior descending artery:

Left circumflex artery: 0

Right coronary artery:

Total:

Percentile: 76th

Pericardium: Normal.

Ascending Aorta: Normal caliber. Ascending aorta measures
approximately 33mm at the mid ascending aorta measured in an axial
plane.

Non-cardiac: See separate report from [REDACTED].
IMPRESSION: Coronary calcium score of 2.24. This was 76th percentile for age-,
race-, and sex-matched controls.



If CAC=0, it is reasonable to withhold statin therapy and reassess
in 5 to 10 years, as long as higher risk conditions are absent
(diabetes mellitus, family history of premature CHD in first degree
relatives (males <55 years; females <65 years), cigarette smoking,
or LDL >=190 mg/dL).

If CAC is 1 to 99, it is reasonable to initiate statin therapy for
patients >=55 years of age.

If CAC is >=100 or >=75th percentile, it is reasonable to initiate
statin therapy at any age.

Cardiology referral should be considered for patients with CAC
scores >=400 or >=75th percentile.

*5811 AHA/ACC/AACVPR/AAPA/ABC/METOR/KOTHARI/KYRON/[employer]/ALASSANE/PACKER/MELTEM
Guideline on the Management of Blood Cholesterol: A Report of the
American College of Cardiology/American Heart Association Task Force
on Clinical Practice Guidelines. J Am Coll Cardiol.
8539;73(24):4883-4725.

*** End of Addendum ***
EXAM:
OVER-READ INTERPRETATION  CT CHEST

The following report is a limited chest CT over-read performed by
This over-read does not include interpretation of cardiac or
coronary anatomy or pathology. The coronary calcium interpretation
by the cardiologist is attached.
FINDINGS: Vascular: Normal caliber of the visualized thoracic aorta.

Mediastinum/Nodes: Visualized mediastinal structures are normal.

Lungs/Pleura: Subtle 3 mm nodule in the right middle lobe near the
right minor fissure on sequence 4 image 19. Peripheral calcified
granuloma in the medial right lower lobe measures 4 mm on sequence 4
image 17. No large areas of airspace disease or consolidation in the
visualized lungs. No large pleural effusions.

Upper Abdomen: Images of the upper abdomen are unremarkable.

Musculoskeletal: Bridging osteophytes in the visualized thoracic
spine.
IMPRESSION: 1. No acute extracardiac findings.
2. Subtle 3 mm nodule in the right middle lobe. No routine follow-up
imaging is recommended per [HOSPITAL] Guidelines.
These guidelines do not apply to immunocompromised patients and
patients with cancer. Follow up in patients with significant
comorbidities as clinically warranted. For lung cancer screening,
adhere to Lung-RADS guidelines. Reference: Radiology. 5328;

## 2022-12-17 ENCOUNTER — Other Ambulatory Visit: Payer: Self-pay | Admitting: Internal Medicine

## 2023-01-16 ENCOUNTER — Encounter: Payer: Self-pay | Admitting: Internal Medicine

## 2023-01-16 NOTE — Progress Notes (Unsigned)
Subjective:    Patient ID: Jason Olsen, male    DOB: 12-14-1970, 52 y.o.   MRN: [id number]     HPI Jason Olsen is here for a physical exam and his chronic medical problems.   Eating a lot of fruits and veges when he is working - A1c today much better - 5.8%.  doing well - feels good.  He is concerned a little about the weight loss.    Medications and allergies reviewed with patient and updated if appropriate.  Current Outpatient Medications on File Prior to Visit  Medication Sig Dispense Refill   acyclovir (ZOVIRAX) 800 MG tablet Take 4,000 mg by mouth daily.     B Complex-C (B-COMPLEX WITH VITAMIN C) tablet Take by mouth.     Continuous Blood Gluc Receiver (DEXCOM G7 RECEIVER) DEVI USE AS DIRECTED TO CHECK SUGARS 1 each 0   Continuous Blood Gluc Sensor (DEXCOM G7 SENSOR) MISC USE TO CHECK SUGARS 3 each 0   fluticasone (FLONASE) 50 MCG/ACT nasal spray 2 Sprays by each nostril route Once a Day.     neomycin-polymyxin b-dexamethasone (MAXITROL) 3.5-10000-0.1 OINT SMARTSIG:1 Inch(es) In Eye(s) Every Night     prednisoLONE acetate (PRED FORTE) 1 % ophthalmic suspension 1 drop 2 (two) times daily.     PROAIR HFA 108 (90 Base) MCG/ACT inhaler Inhale 1-2 puffs into the lungs every 6 (six) hours as needed for wheezing or shortness of breath. 6.7 g 11   No current facility-administered medications on file prior to visit.    Review of Systems  Constitutional:  Negative for fever.  Eyes:  Negative for visual disturbance.  Respiratory:  Negative for cough, shortness of breath and wheezing.   Cardiovascular:  Negative for chest pain, palpitations and leg swelling.  Gastrointestinal:  Negative for abdominal pain, blood in stool, constipation and diarrhea.       Occ gerd  Genitourinary:  Negative for difficulty urinating and dysuria.  Musculoskeletal:  Positive for arthralgias (mild left knee pain) and back pain (lower back tightness).  Skin:  Negative for rash.  Neurological:  Negative for  light-headedness and headaches.  Psychiatric/Behavioral:  Negative for dysphoric mood. The patient is not nervous/anxious.        Objective:   Vitals:   01/17/23 0908  BP: 126/80  Pulse: 84  Temp: 98.6 F (37 C)  SpO2: 97%   Filed Weights   01/17/23 0908  Weight: 200 lb (90.7 kg)   Body mass index is 27.89 kg/m.  BP Readings from Last 3 Encounters:  01/17/23 126/80  07/17/22 (!) 136/96  07/13/22 (!) 150/100    Wt Readings from Last 3 Encounters:  01/17/23 200 lb (90.7 kg)  07/17/22 215 lb 6.4 oz (97.7 kg)  07/13/22 214 lb (97.1 kg)      Physical Exam Constitutional: He appears well-developed and well-nourished. No distress.  HENT:  Head: Normocephalic and atraumatic.  Right Ear: External ear normal.  Left Ear: External ear normal.  Normal ear canals and TM b/l  Mouth/Throat: Oropharynx is clear and moist. Eyes: Conjunctivae and EOM are normal.  Neck: Neck supple. No tracheal deviation present. No thyromegaly present.  No carotid bruit  Cardiovascular: Normal rate, regular rhythm, normal heart sounds and intact distal pulses.   No murmur heard.  No lower extremity edema. Pulmonary/Chest: Effort normal and breath sounds normal. No respiratory distress. He has no wheezes. He has no rales.  Abdominal: Soft. He exhibits no distension. There is no tenderness.  Genitourinary: deferred  Lymphadenopathy:  He has no cervical adenopathy.  Skin: Skin is warm and dry. He is not diaphoretic.  Psychiatric: He has a normal mood and affect. His behavior is normal.         Assessment & Plan:   Physical exam: Screening blood work  ordered Exercise   not regular - getting back into it - trying to get back better so he can start exercising again Weight  has lost some weight - weight is good - advised weight loss is secondary to change in eating and not concerning - he will monitor it Substance abuse   none   Reviewed recommended immunizations.   Health Maintenance   Topic Date Due   FOOT EXAM  Never done   OPHTHALMOLOGY EXAM  Never done   COLONOSCOPY (Pts 45-86yrs Insurance coverage will need to be confirmed)  Never done   INFLUENZA VACCINE  01/27/2023 (Originally 05/29/2022)   COVID-19 Vaccine (2 - 2023-24 season) 02/02/2023 (Originally 06/29/2022)   DTaP/Tdap/Td (2 - Td or Tdap) 03/10/2023   Diabetic kidney evaluation - Urine ACR  07/18/2023   HEMOGLOBIN A1C  07/20/2023   Diabetic kidney evaluation - eGFR measurement  01/17/2024   Hepatitis C Screening  Completed   HIV Screening  Completed   HPV VACCINES  Aged Out   Had eye exam at [facility]  See Problem List for Assessment and Plan of chronic medical problems.

## 2023-01-16 NOTE — Patient Instructions (Addendum)
Blood work was ordered.   The lab is on the first floor.    Medications changes include :   none    A referral was ordered for GI for colonoscopy.     Someone will call you to schedule an appointment.    Return in about 6 months (around 07/20/2023) for follow up.    Health Maintenance, Male Adopting a healthy lifestyle and getting preventive care are important in promoting health and wellness. Ask your health care provider about: The right schedule for you to have regular tests and exams. Things you can do on your own to prevent diseases and keep yourself healthy. What should I know about diet, weight, and exercise? Eat a healthy diet  Eat a diet that includes plenty of vegetables, fruits, low-fat dairy products, and lean protein. Do not eat a lot of foods that are high in solid fats, added sugars, or sodium. Maintain a healthy weight Body mass index (BMI) is a measurement that can be used to identify possible weight problems. It estimates body fat based on height and weight. Your health care provider can help determine your BMI and help you achieve or maintain a healthy weight. Get regular exercise Get regular exercise. This is one of the most important things you can do for your health. Most adults should: Exercise for at least 150 minutes each week. The exercise should increase your heart rate and make you sweat (moderate-intensity exercise). Do strengthening exercises at least twice a week. This is in addition to the moderate-intensity exercise. Spend less time sitting. Even light physical activity can be beneficial. Watch cholesterol and blood lipids Have your blood tested for lipids and cholesterol at 50 years of age, then have this test every 5 years. You may need to have your cholesterol levels checked more often if: Your lipid or cholesterol levels are high. You are older than 50 years of age. You are at high risk for heart disease. What should I know about  cancer screening? Many types of cancers can be detected early and may often be prevented. Depending on your health history and family history, you may need to have cancer screening at various ages. This may include screening for: Colorectal cancer. Prostate cancer. Skin cancer. Lung cancer. What should I know about heart disease, diabetes, and high blood pressure? Blood pressure and heart disease High blood pressure causes heart disease and increases the risk of stroke. This is more likely to develop in people who have high blood pressure readings or are overweight. Talk with your health care provider about your target blood pressure readings. Have your blood pressure checked: Every 3-5 years if you are 26-96 years of age. Every year if you are 57 years old or older. If you are between the ages of 40 and 54 and are a current or former smoker, ask your health care provider if you should have a one-time screening for abdominal aortic aneurysm (AAA). Diabetes Have regular diabetes screenings. This checks your fasting blood sugar level. Have the screening done: Once every three years after age 48 if you are at a normal weight and have a low risk for diabetes. More often and at a younger age if you are overweight or have a high risk for diabetes. What should I know about preventing infection? Hepatitis B If you have a higher risk for hepatitis B, you should be screened for this virus. Talk with your health care provider to find out if you are  at risk for hepatitis B infection. Hepatitis C Blood testing is recommended for: Everyone born from [birth year] through 1963. Anyone with known risk factors for hepatitis C. Sexually transmitted infections (STIs) You should be screened each year for STIs, including gonorrhea and chlamydia, if: You are sexually active and are younger than 50 years of age. You are older than 50 years of age and your health care provider tells you that you are at risk for this type  of infection. Your sexual activity has changed since you were last screened, and you are at increased risk for chlamydia or gonorrhea. Ask your health care provider if you are at risk. Ask your health care provider about whether you are at high risk for HIV. Your health care provider may recommend a prescription medicine to help prevent HIV infection. If you choose to take medicine to prevent HIV, you should first get tested for HIV. You should then be tested every 3 months for as long as you are taking the medicine. Follow these instructions at home: Alcohol use Do not drink alcohol if your health care provider tells you not to drink. If you drink alcohol: Limit how much you have to 0-2 drinks a day. Know how much alcohol is in your drink. In the [place] one drink equals one 12 oz bottle of beer (355 mL), one 5 oz glass of wine (148 mL), or one 1 oz glass of hard liquor (44 mL). Lifestyle Do not use any products that contain nicotine or tobacco. These products include cigarettes, chewing tobacco, and vaping devices, such as e-cigarettes. If you need help quitting, ask your health care provider. Do not use street drugs. Do not share needles. Ask your health care provider for help if you need support or information about quitting drugs. General instructions Schedule regular health, dental, and eye exams. Stay current with your vaccines. Tell your health care provider if: You often feel depressed. You have ever been abused or do not feel safe at home. Summary Adopting a healthy lifestyle and getting preventive care are important in promoting health and wellness. Follow your health care provider's instructions about healthy diet, exercising, and getting tested or screened for diseases. Follow your health care provider's instructions on monitoring your cholesterol and blood pressure. This information is not intended to replace advice given to you by your health care provider. Make sure you discuss  any questions you have with your health care provider. Document Revised: 03/06/2021 Document Reviewed: 03/06/2021 Elsevier Patient Education  [place].

## 2023-01-17 ENCOUNTER — Ambulatory Visit (INDEPENDENT_AMBULATORY_CARE_PROVIDER_SITE_OTHER): Payer: BC Managed Care – PPO | Admitting: Internal Medicine

## 2023-01-17 VITALS — BP 126/80 | HR 84 | Temp 98.6°F | Ht 71.0 in | Wt 200.0 lb

## 2023-01-17 DIAGNOSIS — Z1211 Encounter for screening for malignant neoplasm of colon: Secondary | ICD-10-CM

## 2023-01-17 DIAGNOSIS — E119 Type 2 diabetes mellitus without complications: Secondary | ICD-10-CM | POA: Diagnosis not present

## 2023-01-17 DIAGNOSIS — Z125 Encounter for screening for malignant neoplasm of prostate: Secondary | ICD-10-CM

## 2023-01-17 DIAGNOSIS — J45909 Unspecified asthma, uncomplicated: Secondary | ICD-10-CM

## 2023-01-17 DIAGNOSIS — R5383 Other fatigue: Secondary | ICD-10-CM | POA: Diagnosis not present

## 2023-01-17 DIAGNOSIS — E782 Mixed hyperlipidemia: Secondary | ICD-10-CM | POA: Diagnosis not present

## 2023-01-17 DIAGNOSIS — I10 Essential (primary) hypertension: Secondary | ICD-10-CM

## 2023-01-17 DIAGNOSIS — Z0000 Encounter for general adult medical examination without abnormal findings: Secondary | ICD-10-CM | POA: Diagnosis not present

## 2023-01-17 DIAGNOSIS — J309 Allergic rhinitis, unspecified: Secondary | ICD-10-CM

## 2023-01-17 DIAGNOSIS — R7303 Prediabetes: Secondary | ICD-10-CM

## 2023-01-17 LAB — COMPREHENSIVE METABOLIC PANEL
ALT: 30 U/L (ref 0–53)
AST: 23 U/L (ref 0–37)
Albumin: 4.6 g/dL (ref 3.5–5.2)
Alkaline Phosphatase: 71 U/L (ref 39–117)
BUN: 15 mg/dL (ref 6–23)
CO2: 28 mEq/L (ref 19–32)
Calcium: 9.5 mg/dL (ref 8.4–10.5)
Chloride: 103 mEq/L (ref 96–112)
Creatinine, Ser: 1.2 mg/dL (ref 0.40–1.50)
GFR: 70.8 mL/min (ref >60.00)
Glucose, Bld: 107 mg/dL — ABNORMAL HIGH (ref 70–99)
Potassium: 4 mEq/L (ref 3.5–5.1)
Sodium: 140 mEq/L (ref 135–145)
Total Bilirubin: 0.4 mg/dL (ref 0.2–1.2)
Total Protein: 7.6 g/dL (ref 6.0–8.3)

## 2023-01-17 LAB — CBC WITH DIFFERENTIAL/PLATELET
Basophils Absolute: 0 10*3/uL (ref 0.0–0.1)
Basophils Relative: 0.7 % (ref 0.0–3.0)
Eosinophils Absolute: 0.2 10*3/uL (ref 0.0–0.7)
Eosinophils Relative: 3.5 % (ref 0.0–5.0)
HCT: 42.8 % (ref 39.0–52.0)
Hemoglobin: 14.2 g/dL (ref 13.0–17.0)
Lymphocytes Relative: 28.3 % (ref 12.0–46.0)
Lymphs Abs: 1.4 10*3/uL (ref 0.7–4.0)
MCHC: 33.2 g/dL (ref 30.0–36.0)
MCV: 86.4 fl (ref 78.0–100.0)
Monocytes Absolute: 0.5 10*3/uL (ref 0.1–1.0)
Monocytes Relative: 9.7 % (ref 3.0–12.0)
Neutro Abs: 2.9 10*3/uL (ref 1.4–7.7)
Neutrophils Relative %: 57.8 % (ref 43.0–77.0)
Platelets: 224 10*3/uL (ref 150.0–400.0)
RBC: 4.96 Mil/uL (ref 4.22–5.81)
RDW: 13.5 % (ref 11.5–15.5)
WBC: 5 10*3/uL (ref 4.0–10.5)

## 2023-01-17 LAB — LIPID PANEL
Cholesterol: 147 mg/dL (ref 0–200)
HDL: 52.9 mg/dL (ref >39.00)
LDL Cholesterol: 82 mg/dL (ref 0–99)
NonHDL: 94.07
Total CHOL/HDL Ratio: 3
Triglycerides: 58 mg/dL (ref 0.0–149.0)
VLDL: 11.6 mg/dL (ref 0.0–40.0)

## 2023-01-17 LAB — POCT GLYCOSYLATED HEMOGLOBIN (HGB A1C)
HbA1c POC (<> result, manual entry): 5.8 % (ref 4.0–5.6)
HbA1c, POC (controlled diabetic range): 5.8 % (ref 0.0–7.0)
HbA1c, POC (prediabetic range): 5.8 % (ref 5.7–6.4)
Hemoglobin A1C: 5.8 % — AB (ref 4.0–5.6)

## 2023-01-17 LAB — PSA: PSA: 0.58 ng/mL (ref 0.10–4.00)

## 2023-01-17 MED ORDER — ATORVASTATIN CALCIUM 40 MG PO TABS: 40.0000 mg | ORAL_TABLET | Freq: Every day | ORAL | 2 refills | Status: DC

## 2023-01-17 MED ORDER — TADALAFIL 10 MG PO TABS: 10.0000 mg | ORAL_TABLET | ORAL | 3 refills | Status: AC | PRN

## 2023-01-17 MED ORDER — CETIRIZINE HCL 10 MG PO TABS: 10.0000 mg | ORAL_TABLET | Freq: Every day | ORAL | 11 refills | Status: AC

## 2023-01-17 MED ORDER — MONTELUKAST SODIUM 10 MG PO TABS: 10.0000 mg | ORAL_TABLET | Freq: Every day | ORAL | 2 refills | Status: DC

## 2023-01-17 MED ORDER — LISINOPRIL 20 MG PO TABS: 20.0000 mg | ORAL_TABLET | Freq: Every day | ORAL | 2 refills | Status: DC

## 2023-01-17 MED ORDER — AMLODIPINE BESYLATE 10 MG PO TABS: 10.0000 mg | ORAL_TABLET | Freq: Every day | ORAL | 2 refills | Status: DC

## 2023-01-17 NOTE — Assessment & Plan Note (Signed)
Chronic Check lipid panel  Continue atorvastatin 40 mg daily Regular exercise and healthy diet encouraged  

## 2023-01-17 NOTE — Assessment & Plan Note (Signed)
Concerned about energy level Check testosterone level

## 2023-01-17 NOTE — Assessment & Plan Note (Addendum)
Chronic BP controlled Continue lisinopril 20 mg daily, amlodipine 10 mg daily cmp

## 2023-01-17 NOTE — Assessment & Plan Note (Addendum)
Chronic   Lab Results  Component Value Date   HGBA1C 5.8 (A) 01/17/2023   HGBA1C 5.8 01/17/2023   HGBA1C 5.8 01/17/2023   HGBA1C 5.8 01/17/2023  A1c today well controlled Diet controlled Sugars well controlled Stressed regular exercise, diabetic diet

## 2023-01-17 NOTE — Assessment & Plan Note (Signed)
Chronic Controlled Continue Zyrtec 10 mg daily, montelukast 10 mg nightly

## 2023-01-17 NOTE — Assessment & Plan Note (Signed)
Chronic Exercise induced, allergy related Doing maintenance inhaler daily - ? Advair Uses albuterol prn

## 2023-01-21 LAB — TESTOSTERONE, FREE & TOTAL
Free Testosterone: 49 pg/mL (ref 35.0–155.0)
Testosterone, Total, LC-MS-MS: 341 ng/dL (ref 250–1100)

## 2023-06-13 ENCOUNTER — Encounter (INDEPENDENT_AMBULATORY_CARE_PROVIDER_SITE_OTHER): Payer: Self-pay

## 2023-07-03 ENCOUNTER — Telehealth: Payer: Self-pay | Admitting: Internal Medicine

## 2023-07-03 DIAGNOSIS — Z1211 Encounter for screening for malignant neoplasm of colon: Secondary | ICD-10-CM

## 2023-07-03 NOTE — Telephone Encounter (Signed)
Patient had a referral to gastro that has been closed. He said he has a new job and would be more able to go to them. He would like to know if that referral can be re-opened for him. Best callback is [phone].

## 2023-07-04 NOTE — Telephone Encounter (Signed)
Patient notified of referral

## 2023-07-04 NOTE — Telephone Encounter (Signed)
Referral ordered

## 2023-07-17 ENCOUNTER — Encounter: Payer: Self-pay | Admitting: Internal Medicine

## 2023-07-17 NOTE — Progress Notes (Unsigned)
Subjective:    Patient ID: Jason Olsen, male    DOB: 1971-01-15, 52 y.o.   MRN: [id number]     HPI Jason Olsen is here for follow up of his chronic medical problems.  Jason Olsen well.  Not exercising - management eating.    Occ tingling in right lower leg.   Sometimes numbness in certain activities - usually moves or changes position in bed and it goes away.   Sees chiropractor and acupuncture - helping for back.    Medications and allergies reviewed with patient and updated if appropriate.  Current Outpatient Medications on File Prior to Visit  Medication Sig Dispense Refill   acyclovir (ZOVIRAX) 800 MG tablet Take 4,000 mg by mouth daily.     amLODipine (NORVASC) 10 MG tablet Take 1 tablet (10 mg total) by mouth daily. 90 tablet 2   atorvastatin (LIPITOR) 40 MG tablet Take 1 tablet (40 mg total) by mouth at bedtime. 90 tablet 2   B Complex-C (B-COMPLEX WITH VITAMIN C) tablet Take by mouth.     cetirizine (ZYRTEC) 10 MG tablet Take 1 tablet (10 mg total) by mouth daily. 30 tablet 11   Continuous Blood Gluc Receiver (DEXCOM G7 RECEIVER) DEVI USE AS DIRECTED TO CHECK SUGARS 1 each 0   Continuous Blood Gluc Sensor (DEXCOM G7 SENSOR) MISC USE TO CHECK SUGARS 3 each 0   fluticasone (FLONASE) 50 MCG/ACT nasal spray 2 Sprays by each nostril route Once a Day.     lisinopril (ZESTRIL) 20 MG tablet Take 1 tablet (20 mg total) by mouth daily. 90 tablet 2   montelukast (SINGULAIR) 10 MG tablet Take 1 tablet (10 mg total) by mouth at bedtime. 90 tablet 2   neomycin-polymyxin b-dexamethasone (MAXITROL) 3.5-10000-0.1 OINT SMARTSIG:1 Inch(es) In Eye(s) Every Night     prednisoLONE acetate (PRED FORTE) 1 % ophthalmic suspension 1 drop 2 (two) times daily.     PROAIR HFA 108 (90 Base) MCG/ACT inhaler Inhale 1-2 puffs into the lungs every 6 (six) hours as needed for wheezing or shortness of breath. 6.7 g 11   tadalafil (CIALIS) 10 MG tablet Take 1 tablet (10 mg total) by mouth as needed. 10 tablet 3    No current facility-administered medications on file prior to visit.     Review of Systems  Constitutional:  Negative for fever.  Respiratory:  Negative for cough, shortness of breath and wheezing.   Cardiovascular:  Negative for chest pain, palpitations and leg swelling.  Musculoskeletal:  Positive for back pain (chronic).  Neurological:  Negative for dizziness, light-headedness and headaches.       Objective:   Vitals:   07/18/23 0902  BP: 122/78  Pulse: 77  Temp: 98.6 F (37 C)  SpO2: 99%   BP Readings from Last 3 Encounters:  07/18/23 122/78  01/17/23 126/80  07/17/22 (!) 136/96   Wt Readings from Last 3 Encounters:  07/18/23 212 lb (96.2 kg)  01/17/23 200 lb (90.7 kg)  07/17/22 215 lb 6.4 oz (97.7 kg)   Body mass index is 29.57 kg/m.    Physical Exam Constitutional:      General: He is not in acute distress.    Appearance: Normal appearance. He is not ill-appearing.  HENT:     Head: Normocephalic and atraumatic.  Eyes:     Conjunctiva/sclera: Conjunctivae normal.  Cardiovascular:     Rate and Rhythm: Normal rate and regular rhythm.     Heart sounds: Normal heart sounds.  Pulmonary:  Effort: Pulmonary effort is normal. No respiratory distress.     Breath sounds: Normal breath sounds. No wheezing or rales.  Musculoskeletal:     Right lower leg: No edema.     Left lower leg: No edema.  Skin:    General: Skin is warm and dry.     Findings: No rash.  Neurological:     Mental Status: He is alert. Mental status is at baseline.  Psychiatric:        Mood and Affect: Mood normal.        Lab Results  Component Value Date   WBC 5.0 01/17/2023   HGB 14.2 01/17/2023   HCT 42.8 01/17/2023   PLT 224.0 01/17/2023   GLUCOSE 107 (H) 01/17/2023   CHOL 147 01/17/2023   TRIG 58.0 01/17/2023   HDL 52.90 01/17/2023   LDLCALC 82 01/17/2023   ALT 30 01/17/2023   AST 23 01/17/2023   NA 140 01/17/2023   K 4.0 01/17/2023   CL 103 01/17/2023   CREATININE  1.20 01/17/2023   BUN 15 01/17/2023   CO2 28 01/17/2023   TSH 1.06 08/18/2021   PSA 0.58 01/17/2023   HGBA1C 5.8 (A) 01/17/2023   HGBA1C 5.8 01/17/2023   HGBA1C 5.8 01/17/2023   HGBA1C 5.8 01/17/2023   MICROALBUR 2.3 (H) 07/17/2022     Assessment & Plan:    See Problem List for Assessment and Plan of chronic medical problems.

## 2023-07-17 NOTE — Patient Instructions (Addendum)
     Call to schedule your colonoscopy - Phone: [phone]   An EKG was done    Blood work was ordered.   The lab is on the first floor.    Medications changes include :   none    Return in about 6 months (around 01/15/2024) for Physical Exam.

## 2023-07-18 ENCOUNTER — Ambulatory Visit: Payer: BC Managed Care – PPO | Admitting: Internal Medicine

## 2023-07-18 VITALS — BP 122/78 | HR 77 | Temp 98.6°F | Ht 71.0 in | Wt 212.0 lb

## 2023-07-18 DIAGNOSIS — E782 Mixed hyperlipidemia: Secondary | ICD-10-CM | POA: Diagnosis not present

## 2023-07-18 DIAGNOSIS — I10 Essential (primary) hypertension: Secondary | ICD-10-CM | POA: Diagnosis not present

## 2023-07-18 DIAGNOSIS — J45909 Unspecified asthma, uncomplicated: Secondary | ICD-10-CM | POA: Diagnosis not present

## 2023-07-18 DIAGNOSIS — E119 Type 2 diabetes mellitus without complications: Secondary | ICD-10-CM | POA: Diagnosis not present

## 2023-07-18 DIAGNOSIS — Z23 Encounter for immunization: Secondary | ICD-10-CM | POA: Diagnosis not present

## 2023-07-18 DIAGNOSIS — J309 Allergic rhinitis, unspecified: Secondary | ICD-10-CM

## 2023-07-18 LAB — COMPREHENSIVE METABOLIC PANEL
ALT: 31 U/L (ref 0–53)
AST: 25 U/L (ref 0–37)
Albumin: 4.3 g/dL (ref 3.5–5.2)
Alkaline Phosphatase: 81 U/L (ref 39–117)
BUN: 15 mg/dL (ref 6–23)
CO2: 31 mEq/L (ref 19–32)
Calcium: 9.1 mg/dL (ref 8.4–10.5)
Chloride: 104 mEq/L (ref 96–112)
Creatinine, Ser: 1.25 mg/dL (ref 0.40–1.50)
GFR: 67.18 mL/min (ref >60.00)
Glucose, Bld: 124 mg/dL — ABNORMAL HIGH (ref 70–99)
Potassium: 4 mEq/L (ref 3.5–5.1)
Sodium: 141 mEq/L (ref 135–145)
Total Bilirubin: 0.6 mg/dL (ref 0.2–1.2)
Total Protein: 7.5 g/dL (ref 6.0–8.3)

## 2023-07-18 LAB — LIPID PANEL
Cholesterol: 187 mg/dL (ref 0–200)
HDL: 55 mg/dL (ref >39.00)
LDL Cholesterol: 120 mg/dL — ABNORMAL HIGH (ref 0–99)
NonHDL: 132.48
Total CHOL/HDL Ratio: 3
Triglycerides: 64 mg/dL (ref 0.0–149.0)
VLDL: 12.8 mg/dL (ref 0.0–40.0)

## 2023-07-18 LAB — HEMOGLOBIN A1C: Hgb A1c MFr Bld: 6.9 % — ABNORMAL HIGH (ref 4.6–6.5)

## 2023-07-18 LAB — MICROALBUMIN / CREATININE URINE RATIO
Creatinine,U: 92.4 mg/dL
Microalb Creat Ratio: 0.8 mg/g (ref 0.0–30.0)
Microalb, Ur: <0.7 mg/dL (ref 0.0–1.9)

## 2023-07-18 NOTE — Assessment & Plan Note (Signed)
Chronic Check lipid panel  Continue atorvastatin 40 mg daily Regular exercise and healthy diet encouraged

## 2023-07-18 NOTE — Assessment & Plan Note (Addendum)
Chronic  Lab Results  Component Value Date   HGBA1C 5.8 (A) 01/17/2023   HGBA1C 5.8 01/17/2023   HGBA1C 5.8 01/17/2023   HGBA1C 5.8 01/17/2023   A1c, urine microalbumin today Diet controlled Sugars well controlled Stressed regular exercise, diabetic diet

## 2023-07-18 NOTE — Addendum Note (Signed)
Addended by: Karma Ganja on: 07/18/2023 09:51 AM   Modules accepted: Orders

## 2023-07-18 NOTE — Assessment & Plan Note (Signed)
Chronic Controlled Continue Zyrtec 10 mg daily, montelukast 10 mg nightly

## 2023-07-18 NOTE — Assessment & Plan Note (Signed)
Chronic Exercise induced, allergy related Doing maintenance inhaler daily - ? Advair Uses albuterol prn

## 2023-07-18 NOTE — Assessment & Plan Note (Addendum)
Chronic BP controlled Continue lisinopril 20 mg daily, amlodipine 10 mg daily cmp  EKG:  NSR @ 72 bpm, possible LAE.  No prior for comparison

## 2023-08-06 ENCOUNTER — Ambulatory Visit (AMBULATORY_SURGERY_CENTER): Payer: BC Managed Care – PPO

## 2023-08-06 ENCOUNTER — Encounter: Payer: Self-pay | Admitting: Gastroenterology

## 2023-08-06 VITALS — Ht 71.0 in | Wt 210.0 lb

## 2023-08-06 DIAGNOSIS — Z1211 Encounter for screening for malignant neoplasm of colon: Secondary | ICD-10-CM

## 2023-08-06 MED ORDER — NA SULFATE-K SULFATE-MG SULF 17.5-3.13-1.6 GM/177ML PO SOLN
1.0000 | Freq: Once | ORAL | 0 refills | Status: AC
Start: 2023-08-06 — End: 2023-08-06

## 2023-08-06 NOTE — Progress Notes (Signed)

## 2023-08-13 ENCOUNTER — Other Ambulatory Visit: Payer: Self-pay | Admitting: Medical Genetics

## 2023-08-13 DIAGNOSIS — Z006 Encounter for examination for normal comparison and control in clinical research program: Secondary | ICD-10-CM

## 2023-08-20 ENCOUNTER — Ambulatory Visit (AMBULATORY_SURGERY_CENTER): Payer: BC Managed Care – PPO | Admitting: Gastroenterology

## 2023-08-20 ENCOUNTER — Encounter: Payer: Self-pay | Admitting: Gastroenterology

## 2023-08-20 VITALS — BP 125/79 | HR 72 | Temp 97.3°F | Resp 16 | Ht 71.0 in | Wt 208.0 lb

## 2023-08-20 DIAGNOSIS — Z1211 Encounter for screening for malignant neoplasm of colon: Secondary | ICD-10-CM | POA: Diagnosis not present

## 2023-08-20 MED ORDER — SODIUM CHLORIDE 0.9 % IV SOLN: 500.0000 mL | INTRAVENOUS | Status: DC

## 2023-08-20 NOTE — Patient Instructions (Signed)
-  Handout on hemorrhoids provided -repeat colonoscopy in 10 years for surveillance recommended.  -Continue present medications   YOU HAD AN ENDOSCOPIC PROCEDURE TODAY AT THE [facility]:   Refer to the procedure report that was given to you for any specific questions about what was found during the examination.  If the procedure report does not answer your questions, please call your gastroenterologist to clarify.  If you requested that your care partner not be given the details of your procedure findings, then the procedure report has been included in a sealed envelope for you to review at your convenience later.  YOU SHOULD EXPECT: Some feelings of bloating in the abdomen. Passage of more gas than usual.  Walking can help get rid of the air that was put into your GI tract during the procedure and reduce the bloating. If you had a lower endoscopy (such as a colonoscopy or flexible sigmoidoscopy) you may notice spotting of blood in your stool or on the toilet paper. If you underwent a bowel prep for your procedure, you may not have a normal bowel movement for a few days.  Please Note:  You might notice some irritation and congestion in your nose or some drainage.  This is from the oxygen used during your procedure.  There is no need for concern and it should clear up in a day or so.  SYMPTOMS TO REPORT IMMEDIATELY:  Following lower endoscopy (colonoscopy or flexible sigmoidoscopy):  Excessive amounts of blood in the stool  Significant tenderness or worsening of abdominal pains  Swelling of the abdomen that is new, acute  Fever of 100F or higher For urgent or emergent issues, a gastroenterologist can be reached at any hour by calling [phone]. Do not use MyChart messaging for urgent concerns.    DIET:  We do recommend a small meal at first, but then you may proceed to your regular diet.  Drink plenty of fluids but you should avoid alcoholic beverages for 24 hours.  ACTIVITY:   You should plan to take it easy for the rest of today and you should NOT DRIVE or use heavy machinery until tomorrow (because of the sedation medicines used during the test).    FOLLOW UP: Our staff will call the number listed on your records the next business day following your procedure.  We will call around 7:15- 8:00 am to check on you and address any questions or concerns that you may have regarding the information given to you following your procedure. If we do not reach you, we will leave a message.     If any biopsies were taken you will be contacted by phone or by letter within the next 1-3 weeks.  Please call us at [phone] if you have not heard about the biopsies in 3 weeks.    SIGNATURES/CONFIDENTIALITY: You and/or your care partner have signed paperwork which will be entered into your electronic medical record.  These signatures attest to the fact that that the information above on your After Visit Summary has been reviewed and is understood.  Full responsibility of the confidentiality of this discharge information lies with you and/or your care-partner.

## 2023-08-20 NOTE — Op Note (Signed)
[facility] Patient Name: Jason Olsen Procedure Date: 08/20/2023 9:19 AM MRN: [id number] Endoscopist: Meryl Dare , MD, [phone] Age: 52 Referring MD:  Date of Birth: 1971-08-30 Gender: Male Account #: [id number] Procedure:                Colonoscopy Indications:              Screening for colorectal malignant neoplasm Medicines:                Monitored Anesthesia Care Procedure:                Pre-Anesthesia Assessment:                           - Prior to the procedure, a History and Physical                            was performed, and patient medications and                            allergies were reviewed. The patient's tolerance of                            previous anesthesia was also reviewed. The risks                            and benefits of the procedure and the sedation                            options and risks were discussed with the patient.                            All questions were answered, and informed consent                            was obtained. Prior Anticoagulants: The patient has                            taken no anticoagulant or antiplatelet agents. ASA                            Grade Assessment: II - A patient with mild systemic                            disease. After reviewing the risks and benefits,                            the patient was deemed in satisfactory condition to                            undergo the procedure.                           After obtaining informed consent, the colonoscope  was passed under direct vision. Throughout the                            procedure, the patient's blood pressure, pulse, and                            oxygen saturations were monitored continuously. The                            CF [id number] #[id number] was introduced through the anus                            and advanced to the the cecum, identified by                            appendiceal orifice  and ileocecal valve. The                            ileocecal valve, appendiceal orifice, and rectum                            were photographed. The quality of the bowel                            preparation was good. The colonoscopy was performed                            without difficulty. The patient tolerated the                            procedure well. Scope In: 9:23:18 AM Scope Out: 9:37:40 AM Scope Withdrawal Time: 0 hours 9 minutes 52 seconds  Total Procedure Duration: 0 hours 14 minutes 22 seconds  Findings:                 The perianal and digital rectal examinations were                            normal.                           External and internal hemorrhoids were found during                            retroflexion. The hemorrhoids were small and Grade                            I (internal hemorrhoids that do not prolapse).                           The exam was otherwise without abnormality on                            direct and retroflexion views. Complications:            No immediate  complications. Estimated blood loss:                            None. Estimated Blood Loss:     Estimated blood loss: none. Impression:               - Internal and external hemorrhoids.                           - The examination was otherwise normal on direct                            and retroflexion views.                           - No specimens collected. Recommendation:           - Repeat colonoscopy in 10 years for screening                            purposes.                           - Patient has a contact number available for                            emergencies. The signs and symptoms of potential                            delayed complications were discussed with the                            patient. Return to normal activities tomorrow.                            Written discharge instructions were provided to the                            patient.                            - Resume previous diet.                           - Continue present medications. Meryl Dare, MD 08/20/2023 9:41:06 AM This report has been signed electronically.

## 2023-08-20 NOTE — Progress Notes (Signed)
Sedate, gd SR, tolerated procedure well, VSS, report to RN 

## 2023-08-20 NOTE — Progress Notes (Signed)
Patient states there have been no changes to medical or surgical history since time of pre-visit. 

## 2023-08-20 NOTE — Progress Notes (Signed)
History & Physical  Primary Care Physician:  Pincus Sanes, MD Primary Gastroenterologist: Claudette Head, MD  Impression / Plan:  Average risk CRC screening for colonoscopy.  CHIEF COMPLAINT:  CRC screening   HPI: Jason Olsen is a 52 y.o. male average risk CRC screening for colonoscopy.    Past Medical History:  Diagnosis Date   Hypertension     Past Surgical History:  Procedure Laterality Date   KIDNEY DONATION Right    KNEE ARTHROSCOPY W/ MEDIAL COLLATERAL LIGAMENT (MCL) REPAIR Right     Prior to Admission medications   Medication Sig Start Date End Date Taking? Authorizing Provider  amLODipine (NORVASC) 10 MG tablet Take 1 tablet (10 mg total) by mouth daily. 01/17/23  Yes Burns, Bobette Mo, MD  atorvastatin (LIPITOR) 40 MG tablet Take 1 tablet (40 mg total) by mouth at bedtime. 01/17/23  Yes Burns, Bobette Mo, MD  B Complex-C (B-COMPLEX WITH VITAMIN C) tablet Take by mouth.   Yes [provider]  cetirizine (ZYRTEC) 10 MG tablet Take 1 tablet (10 mg total) by mouth daily. 01/17/23  Yes Burns, Bobette Mo, MD  fluticasone (FLONASE) 50 MCG/ACT nasal spray 2 Sprays by each nostril route Once a Day. 01/20/14  Yes [provider]  lisinopril (ZESTRIL) 20 MG tablet Take 1 tablet (20 mg total) by mouth daily. 01/17/23  Yes Burns, Bobette Mo, MD  montelukast (SINGULAIR) 10 MG tablet Take 1 tablet (10 mg total) by mouth at bedtime. 01/17/23  Yes Burns, Bobette Mo, MD  tadalafil (CIALIS) 10 MG tablet Take 1 tablet (10 mg total) by mouth as needed. 01/17/23  Yes Burns, Bobette Mo, MD  acyclovir (ZOVIRAX) 800 MG tablet Take 4,000 mg by mouth daily. Patient not taking: Reported on 08/06/2023 06/29/22   [provider]  Continuous Blood Gluc Receiver (DEXCOM G7 RECEIVER) DEVI USE AS DIRECTED TO CHECK SUGARS 06/22/22   Burns, Bobette Mo, MD  Continuous Blood Gluc Sensor (DEXCOM G7 SENSOR) MISC USE TO CHECK SUGARS 12/17/22   Burns, Bobette Mo, MD  neomycin-polymyxin b-dexamethasone (MAXITROL)  3.5-10000-0.1 OINT SMARTSIG:1 Inch(es) In Eye(s) Every Night Patient not taking: Reported on 08/06/2023 06/16/22   [provider]  prednisoLONE acetate (PRED FORTE) 1 % ophthalmic suspension 1 drop 2 (two) times daily. Patient not taking: Reported on 08/06/2023 06/16/22   [provider]  PROAIR HFA 108 [phone] Base) MCG/ACT inhaler Inhale 1-2 puffs into the lungs every 6 (six) hours as needed for wheezing or shortness of breath. 02/19/22   Pincus Sanes, MD    Current Outpatient Medications  Medication Sig Dispense Refill   amLODipine (NORVASC) 10 MG tablet Take 1 tablet (10 mg total) by mouth daily. 90 tablet 2   atorvastatin (LIPITOR) 40 MG tablet Take 1 tablet (40 mg total) by mouth at bedtime. 90 tablet 2   B Complex-C (B-COMPLEX WITH VITAMIN C) tablet Take by mouth.     cetirizine (ZYRTEC) 10 MG tablet Take 1 tablet (10 mg total) by mouth daily. 30 tablet 11   fluticasone (FLONASE) 50 MCG/ACT nasal spray 2 Sprays by each nostril route Once a Day.     lisinopril (ZESTRIL) 20 MG tablet Take 1 tablet (20 mg total) by mouth daily. 90 tablet 2   montelukast (SINGULAIR) 10 MG tablet Take 1 tablet (10 mg total) by mouth at bedtime. 90 tablet 2   tadalafil (CIALIS) 10 MG tablet Take 1 tablet (10 mg total) by mouth as needed. 10 tablet 3   acyclovir (ZOVIRAX)  800 MG tablet Take 4,000 mg by mouth daily. (Patient not taking: Reported on 08/06/2023)     Continuous Blood Gluc Receiver (DEXCOM G7 RECEIVER) DEVI USE AS DIRECTED TO CHECK SUGARS 1 each 0   Continuous Blood Gluc Sensor (DEXCOM G7 SENSOR) MISC USE TO CHECK SUGARS 3 each 0   neomycin-polymyxin b-dexamethasone (MAXITROL) 3.5-10000-0.1 OINT SMARTSIG:1 Inch(es) In Eye(s) Every Night (Patient not taking: Reported on 08/06/2023)     prednisoLONE acetate (PRED FORTE) 1 % ophthalmic suspension 1 drop 2 (two) times daily. (Patient not taking: Reported on 08/06/2023)     PROAIR HFA 108 (90 Base) MCG/ACT inhaler Inhale 1-2 puffs into the lungs  every 6 (six) hours as needed for wheezing or shortness of breath. 6.7 g 11   Current Facility-Administered Medications  Medication Dose Route Frequency Provider Last Rate Last Admin   0.9 %  sodium chloride infusion  500 mL Intravenous Continuous Meryl Dare, MD        Allergies as of 08/20/2023 - Review Complete 08/20/2023  Allergen Reaction Noted   Penicillins  02/15/2020   Sulfa antibiotics Rash 12/06/2014    Family History  Problem Relation Age of Onset   Cancer Mother        breast   Hypertension Mother    Cerebral aneurysm Mother    Kidney disease Father    Heart disease Father    Diabetes Father    Colon cancer Neg Hx    Colon polyps Neg Hx    Esophageal cancer Neg Hx    Rectal cancer Neg Hx    Stomach cancer Neg Hx     Social History   Socioeconomic History   Marital status: Married    Spouse name: Not on file   Number of children: Not on file   Years of education: Not on file   Highest education level: Not on file  Occupational History   Not on file  Tobacco Use   Smoking status: Never   Smokeless tobacco: Never  Substance and Sexual Activity   Alcohol use: Never   Drug use: Never   Sexual activity: Not on file  Other Topics Concern   Not on file  Social History Narrative   Not on file   Social Determinants of Health   Financial Resource Strain: Not on file  Food Insecurity: Not on file  Transportation Needs: Not on file  Physical Activity: Not on file  Stress: Not on file  Social Connections: Unknown (03/09/2022)   Received from [facility], [facility]   Social Network    Social Network: Not on file  Intimate Partner Violence: Unknown (01/29/2022)   Received from [facility], [facility]   HITS    Physically Hurt: Not on file    Insult or Talk Down To: Not on file    Threaten Physical Harm: Not on file    Scream or Curse: Not on file    Review of Systems:  All systems reviewed were negative except where noted in  HPI.   Physical Exam:  General:  Alert, well-developed, in NAD Head:  Normocephalic and atraumatic. Eyes:  Sclera clear, no icterus.   Conjunctiva pink. Ears:  Normal auditory acuity. Mouth:  No deformity or lesions.  Neck:  Supple; no masses. Lungs:  Clear throughout to auscultation.   No wheezes, crackles, or rhonchi.  Heart:  Regular rate and rhythm; no murmurs. Abdomen:  Soft, nondistended, nontender. No masses, hepatomegaly. No palpable masses.  Normal bowel sounds.  Rectal:  Deferred   Msk:  Symmetrical without gross deformities. Extremities:  Without edema. Neurologic:  Alert and  oriented x 4; grossly normal neurologically. Skin:  Intact without significant lesions or rashes. Psych:  Alert and cooperative. Normal mood and affect.   Venita Lick. Russella Dar  08/20/2023, 9:07 AM See Loretha Stapler, [facility], to contact our on call provider

## 2023-08-21 ENCOUNTER — Telehealth: Payer: Self-pay

## 2023-08-21 NOTE — Telephone Encounter (Signed)
  Follow up Call-     08/20/2023    8:43 AM  Call back number  Post procedure Call Back phone  # [phone]  Permission to leave phone message Yes     Patient questions:  Do you have a fever, pain , or abdominal swelling? No. Pain Score  0 *  Have you tolerated food without any problems? Yes.    Have you been able to return to your normal activities? Yes.    Do you have any questions about your discharge instructions: Diet   No. Medications  No. Follow up visit  No.  Do you have questions or concerns about your Care? No.  Actions: * If pain score is 4 or above: No action needed, pain <4.

## 2023-11-22 ENCOUNTER — Other Ambulatory Visit: Payer: Self-pay | Admitting: Internal Medicine

## 2023-12-11 ENCOUNTER — Other Ambulatory Visit: Payer: Self-pay | Admitting: Internal Medicine

## 2024-01-14 ENCOUNTER — Other Ambulatory Visit (HOSPITAL_COMMUNITY): Payer: Self-pay | Attending: Medical Genetics

## 2024-01-14 NOTE — Patient Instructions (Addendum)
      Blood work was ordered.       Medications changes include :   clobetasol solution for your skin    Return in about 6 months (around 07/17/2024) for Physical Exam.

## 2024-01-14 NOTE — Progress Notes (Unsigned)
 Subjective:    Patient ID: Jason Olsen, male    DOB: 09-23-1971, 53 y.o.   MRN: [id number]     HPI Jason Olsen is here for follow up of his chronic medical problems.  Has had some dry spots on his face - ? Eczema.  Had been told he had it in the past and used a rx cream-thinks it was clobetasol which worked well.  No other spots on body.   Doing some walking.  Trying to increase that and do it more regularly.  He has made improvements to his diet  Feet sweat sometimes.  They get itchy and dry.  He has been putting this lotion on and that seems to help.  Medications and allergies reviewed with patient and updated if appropriate.  Current Outpatient Medications on File Prior to Visit  Medication Sig Dispense Refill   amLODipine (NORVASC) 10 MG tablet Take 1 tablet by mouth once daily 90 tablet 0   atorvastatin (LIPITOR) 40 MG tablet Take 1 tablet (40 mg total) by mouth at bedtime. 90 tablet 2   B Complex-C (B-COMPLEX WITH VITAMIN C) tablet Take by mouth.     cetirizine (ZYRTEC) 10 MG tablet Take 1 tablet (10 mg total) by mouth daily. 30 tablet 11   Continuous Blood Gluc Receiver (DEXCOM G7 RECEIVER) DEVI USE AS DIRECTED TO CHECK SUGARS 1 each 0   Continuous Blood Gluc Sensor (DEXCOM G7 SENSOR) MISC USE TO CHECK SUGARS 3 each 0   cromolyn (OPTICROM) 4 % ophthalmic solution 1 drop 2 (two) times daily.     fluticasone (FLONASE) 50 MCG/ACT nasal spray 2 Sprays by each nostril route Once a Day.     lisinopril (ZESTRIL) 20 MG tablet Take 1 tablet by mouth once daily 90 tablet 0   montelukast (SINGULAIR) 10 MG tablet TAKE 1 TABLET BY MOUTH AT BEDTIME 90 tablet 0   PROAIR HFA 108 (90 Base) MCG/ACT inhaler Inhale 1-2 puffs into the lungs every 6 (six) hours as needed for wheezing or shortness of breath. 6.7 g 11   tadalafil (CIALIS) 10 MG tablet Take 1 tablet (10 mg total) by mouth as needed. 10 tablet 3   No current facility-administered medications on file prior to visit.     Review  of Systems  Constitutional:  Negative for fever.  Respiratory:  Positive for cough (mild with seasonal allergies). Negative for shortness of breath and wheezing.   Cardiovascular:  Positive for leg swelling (a little after driving all day). Negative for chest pain and palpitations.  Neurological:  Negative for light-headedness and headaches.       Objective:   Vitals:   01/15/24 0937  BP: 118/72  Pulse: 73  Temp: 98 F (36.7 C)  SpO2: 99%   BP Readings from Last 3 Encounters:  01/15/24 118/72  08/20/23 125/79  07/18/23 122/78   Wt Readings from Last 3 Encounters:  01/15/24 207 lb (93.9 kg)  08/20/23 208 lb (94.3 kg)  08/06/23 210 lb (95.3 kg)   Body mass index is 28.87 kg/m.    Physical Exam Constitutional:      General: He is not in acute distress.    Appearance: Normal appearance. He is not ill-appearing.  HENT:     Head: Normocephalic and atraumatic.  Eyes:     Conjunctiva/sclera: Conjunctivae normal.  Cardiovascular:     Rate and Rhythm: Normal rate and regular rhythm.     Heart sounds: Normal heart sounds.  Pulmonary:  Effort: Pulmonary effort is normal. No respiratory distress.     Breath sounds: Normal breath sounds. No wheezing or rales.  Musculoskeletal:     Right lower leg: No edema.     Left lower leg: No edema.  Skin:    General: Skin is warm and dry.     Findings: No rash.  Neurological:     Mental Status: He is alert. Mental status is at baseline.  Psychiatric:        Mood and Affect: Mood normal.        Lab Results  Component Value Date   WBC 5.0 01/17/2023   HGB 14.2 01/17/2023   HCT 42.8 01/17/2023   PLT 224.0 01/17/2023   GLUCOSE 124 (H) 07/18/2023   CHOL 187 07/18/2023   TRIG 64.0 07/18/2023   HDL 55.00 07/18/2023   LDLCALC 120 (H) 07/18/2023   ALT 31 07/18/2023   AST 25 07/18/2023   NA 141 07/18/2023   K 4.0 07/18/2023   CL 104 07/18/2023   CREATININE 1.25 07/18/2023   BUN 15 07/18/2023   CO2 31 07/18/2023   TSH  1.06 08/18/2021   PSA 0.58 01/17/2023   HGBA1C 6.9 (H) 07/18/2023   MICROALBUR <0.7 07/18/2023     Assessment & Plan:    See Problem List for Assessment and Plan of chronic medical problems.

## 2024-01-15 ENCOUNTER — Ambulatory Visit: Payer: BC Managed Care – PPO | Admitting: Internal Medicine

## 2024-01-15 ENCOUNTER — Encounter: Payer: Self-pay | Admitting: Internal Medicine

## 2024-01-15 VITALS — BP 118/72 | HR 73 | Temp 98.0°F | Ht 71.0 in | Wt 207.0 lb

## 2024-01-15 DIAGNOSIS — N529 Male erectile dysfunction, unspecified: Secondary | ICD-10-CM | POA: Insufficient documentation

## 2024-01-15 DIAGNOSIS — J45909 Unspecified asthma, uncomplicated: Secondary | ICD-10-CM

## 2024-01-15 DIAGNOSIS — E119 Type 2 diabetes mellitus without complications: Secondary | ICD-10-CM

## 2024-01-15 DIAGNOSIS — E782 Mixed hyperlipidemia: Secondary | ICD-10-CM

## 2024-01-15 DIAGNOSIS — J309 Allergic rhinitis, unspecified: Secondary | ICD-10-CM

## 2024-01-15 DIAGNOSIS — I10 Essential (primary) hypertension: Secondary | ICD-10-CM

## 2024-01-15 DIAGNOSIS — L309 Dermatitis, unspecified: Secondary | ICD-10-CM

## 2024-01-15 LAB — COMPREHENSIVE METABOLIC PANEL
ALT: 33 U/L (ref 0–53)
AST: 22 U/L (ref 0–37)
Albumin: 3.9 g/dL (ref 3.5–5.2)
Alkaline Phosphatase: 80 U/L (ref 39–117)
BUN: 22 mg/dL (ref 6–23)
CO2: 31 meq/L (ref 19–32)
Calcium: 9.5 mg/dL (ref 8.4–10.5)
Chloride: 105 meq/L (ref 96–112)
Creatinine, Ser: 1.16 mg/dL (ref 0.40–1.50)
GFR: 73.23 mL/min (ref >60.00)
Glucose, Bld: 123 mg/dL — ABNORMAL HIGH (ref 70–99)
Potassium: 4.2 meq/L (ref 3.5–5.1)
Sodium: 141 meq/L (ref 135–145)
Total Bilirubin: 0.4 mg/dL (ref 0.2–1.2)
Total Protein: 7.4 g/dL (ref 6.0–8.3)

## 2024-01-15 LAB — HEMOGLOBIN A1C: Hgb A1c MFr Bld: 7 % — ABNORMAL HIGH (ref 4.6–6.5)

## 2024-01-15 LAB — LIPID PANEL
Cholesterol: 119 mg/dL (ref 0–200)
HDL: 45.8 mg/dL (ref >39.00)
LDL Cholesterol: 62 mg/dL (ref 0–99)
NonHDL: 72.9
Total CHOL/HDL Ratio: 3
Triglycerides: 56 mg/dL (ref 0.0–149.0)
VLDL: 11.2 mg/dL (ref 0.0–40.0)

## 2024-01-15 MED ORDER — CLOBETASOL PROPIONATE 0.05 % EX SOLN: 1.0000 | Freq: Every day | CUTANEOUS | 1 refills | Status: AC | PRN

## 2024-01-15 NOTE — Assessment & Plan Note (Signed)
Chronic Check lipid panel, cmp Continue atorvastatin 40 mg daily Regular exercise and healthy diet encouraged  

## 2024-01-15 NOTE — Assessment & Plan Note (Signed)
 Chronic History of eczema Has some spots on his face that are dry and itchy and has been treated for eczema in the past Had to use clobetasol in the past and it worked well and he did not need to use it often-will send a prescription for this to his pharmacy Advised only use as needed and small amount-he is aware of skin damage with prolonged or frequent use

## 2024-01-15 NOTE — Assessment & Plan Note (Signed)
Chronic Controlled Continue Zyrtec 10 mg daily, montelukast 10 mg nightly

## 2024-01-15 NOTE — Assessment & Plan Note (Signed)
Chronic BP controlled Continue lisinopril 20 mg daily, amlodipine 10 mg daily cmp

## 2024-01-15 NOTE — Assessment & Plan Note (Signed)
 Chronic Exercise induced, allergy related Uses albuterol prn

## 2024-01-15 NOTE — Assessment & Plan Note (Addendum)
 Chronic  Lab Results  Component Value Date   HGBA1C 6.9 (H) 07/18/2023   A1c Diet controlled Sugars controlled-would like to avoid medication Stressed regular exercise, diabetic diet-he continues to work on both

## 2024-01-15 NOTE — Assessment & Plan Note (Signed)
 Chronic Continue cialis 10 mg daily prn

## 2024-01-16 ENCOUNTER — Encounter: Payer: Self-pay | Admitting: Internal Medicine

## 2024-02-18 ENCOUNTER — Other Ambulatory Visit: Payer: Self-pay | Admitting: Internal Medicine

## 2024-02-19 ENCOUNTER — Other Ambulatory Visit: Payer: Self-pay | Admitting: Internal Medicine

## 2024-02-25 ENCOUNTER — Other Ambulatory Visit: Payer: Self-pay | Admitting: Internal Medicine

## 2024-03-11 ENCOUNTER — Other Ambulatory Visit: Payer: Self-pay | Admitting: Internal Medicine

## 2024-04-08 LAB — LAB REPORT - SCANNED
A1c: 6.6
EGFR: 65

## 2024-04-21 ENCOUNTER — Telehealth: Payer: Self-pay | Admitting: Gastroenterology

## 2024-04-21 NOTE — Telephone Encounter (Signed)
 Inbound call from patient requesting us  to take attention to labs he had done on 6/11.   Requesting a f/u call to discuss next plan of care.   Please advise. Thank you

## 2024-05-20 ENCOUNTER — Other Ambulatory Visit: Payer: Self-pay | Admitting: Internal Medicine

## 2024-05-26 ENCOUNTER — Other Ambulatory Visit: Payer: Self-pay | Admitting: Internal Medicine

## 2024-06-11 ENCOUNTER — Other Ambulatory Visit: Payer: Self-pay | Admitting: Internal Medicine

## 2024-07-20 ENCOUNTER — Encounter: Payer: Self-pay | Admitting: Internal Medicine

## 2024-07-20 NOTE — Progress Notes (Unsigned)
 Subjective:    Patient ID: Jason Olsen, male    DOB: 10/20/1971, 53 y.o.   MRN: [id number]     HPI Jason Olsen is here for a physical exam and his chronic medical problems.  Doing well.  Eating well.  Bringing food with him for lunch.  Sugars well controlled - using CGM.    Medications and allergies reviewed with patient and updated if appropriate.  Current Outpatient Medications on File Prior to Visit  Medication Sig Dispense Refill   amLODipine  (NORVASC ) 10 MG tablet Take 1 tablet by mouth once daily 90 tablet 0   atorvastatin  (LIPITOR) 40 MG tablet TAKE 1 TABLET BY MOUTH AT BEDTIME 90 tablet 0   B Complex-C (B-COMPLEX WITH VITAMIN C) tablet Take by mouth.     cetirizine  (ZYRTEC ) 10 MG tablet Take 1 tablet (10 mg total) by mouth daily. 30 tablet 11   clobetasol  (TEMOVATE ) 0.05 % external solution Apply 1 Application topically daily as needed. 25 mL 1   Continuous Blood Gluc Receiver (DEXCOM G7 RECEIVER) DEVI USE AS DIRECTED TO CHECK SUGARS 1 each 0   Continuous Blood Gluc Sensor (DEXCOM G7 SENSOR) MISC USE TO CHECK SUGARS 3 each 0   cromolyn (OPTICROM) 4 % ophthalmic solution 1 drop 2 (two) times daily.     fluticasone (FLONASE) 50 MCG/ACT nasal spray 2 Sprays by each nostril route Once a Day.     lisinopril  (ZESTRIL ) 20 MG tablet Take 1 tablet by mouth daily 90 tablet 1   montelukast  (SINGULAIR ) 10 MG tablet TAKE 1 TABLET BY MOUTH AT BEDTIME 90 tablet 0   PROAIR  HFA 108 (90 Base) MCG/ACT inhaler Inhale 1-2 puffs into the lungs every 6 (six) hours as needed for wheezing or shortness of breath. 6.7 g 11   tadalafil  (CIALIS ) 10 MG tablet Take 1 tablet (10 mg total) by mouth as needed. 10 tablet 3   No current facility-administered medications on file prior to visit.    Review of Systems  Constitutional:  Negative for fever.  Eyes:  Negative for visual disturbance.  Respiratory:  Negative for cough, shortness of breath and wheezing.   Cardiovascular:  Negative for chest pain,  palpitations and leg swelling.  Gastrointestinal:  Negative for abdominal pain, blood in stool, constipation and diarrhea.       Occ gerd  Genitourinary:  Negative for difficulty urinating, dysuria and hematuria.  Musculoskeletal:  Positive for back pain (chiropractor prn). Negative for arthralgias.  Skin:  Negative for rash.  Neurological:  Negative for light-headedness and headaches.  Psychiatric/Behavioral:  Negative for dysphoric mood. The patient is not nervous/anxious.        Objective:   Vitals:   07/21/24 0926  BP: 128/82  Pulse: 68  Temp: 98.3 F (36.8 C)  SpO2: 98%   Filed Weights   07/21/24 0926  Weight: 199 lb (90.3 kg)   Body mass index is 27.55 kg/m.  BP Readings from Last 3 Encounters:  07/21/24 128/82  01/15/24 118/72  08/20/23 125/79    Wt Readings from Last 3 Encounters:  07/21/24 199 lb (90.3 kg)  01/15/24 207 lb (93.9 kg)  08/20/23 208 lb (94.3 kg)      Physical Exam Constitutional: He appears well-developed and well-nourished. No distress.  HENT:  Head: Normocephalic and atraumatic.  Right Ear: External ear normal.  Left Ear: External ear normal.  Normal ear canals and TM b/l  Mouth/Throat: Oropharynx is clear and moist. Eyes: Conjunctivae and EOM are normal.  Neck: Neck  supple. No tracheal deviation present. No thyromegaly present.  No carotid bruit  Cardiovascular: Normal rate, regular rhythm, normal heart sounds and intact distal pulses.   No murmur heard.  No lower extremity edema. Pulmonary/Chest: Effort normal and breath sounds normal. No respiratory distress. He has no wheezes. He has no rales.  Abdominal: Soft. He exhibits no distension. There is no tenderness.  Genitourinary: deferred  Lymphadenopathy:   He has no cervical adenopathy.  Skin: Skin is warm and dry. He is not diaphoretic.  Psychiatric: He has a normal mood and affect. His behavior is normal.         Assessment & Plan:   Physical exam: Screening blood work   ordered Exercise   treadmill or elliptical x 30 min and circuit Weight  is good Substance abuse   none   Reviewed recommended immunizations.   Health Maintenance  Topic Date Due   FOOT EXAM  Never done   OPHTHALMOLOGY EXAM  Never done   Diabetic kidney evaluation - Urine ACR  Never done   Pneumococcal Vaccine: 50+ Years (1 of 2 - PCV) Never done   Hepatitis B Vaccines 19-59 Average Risk (1 of 3 - 19+ 3-dose series) Never done   Zoster Vaccines- Shingrix (1 of 2) Never done   DTaP/Tdap/Td (2 - Td or Tdap) 03/10/2023   Influenza Vaccine  05/29/2024   COVID-19 Vaccine (1 - 2024-25 season) 08/05/2024 (Originally 06/29/2024)   HEMOGLOBIN A1C  10/08/2024   Diabetic kidney evaluation - eGFR measurement  04/08/2025   Colonoscopy  08/19/2033   Hepatitis C Screening  Completed   HIV Screening  Completed   HPV VACCINES  Aged Out   Meningococcal B Vaccine  Aged Out     See Problem List for Assessment and Plan of chronic medical problems.

## 2024-07-20 NOTE — Patient Instructions (Addendum)
 Tetanus and flu vaccines today   Blood work was ordered.       Medications changes include :   None     Return in about 6 months (around 01/18/2025) for follow up.    Health Maintenance, Male Adopting a healthy lifestyle and getting preventive care are important in promoting health and wellness. Ask your health care provider about: The right schedule for you to have regular tests and exams. Things you can do on your own to prevent diseases and keep yourself healthy. What should I know about diet, weight, and exercise? Eat a healthy diet  Eat a diet that includes plenty of vegetables, fruits, low-fat dairy products, and lean protein. Do not eat a lot of foods that are high in solid fats, added sugars, or sodium. Maintain a healthy weight Body mass index (BMI) is a measurement that can be used to identify possible weight problems. It estimates body fat based on height and weight. Your health care provider can help determine your BMI and help you achieve or maintain a healthy weight. Get regular exercise Get regular exercise. This is one of the most important things you can do for your health. Most adults should: Exercise for at least 150 minutes each week. The exercise should increase your heart rate and make you sweat (moderate-intensity exercise). Do strengthening exercises at least twice a week. This is in addition to the moderate-intensity exercise. Spend less time sitting. Even light physical activity can be beneficial. Watch cholesterol and blood lipids Have your blood tested for lipids and cholesterol at 51 years of age, then have this test every 5 years. You may need to have your cholesterol levels checked more often if: Your lipid or cholesterol levels are high. You are older than 51 years of age. You are at high risk for heart disease. What should I know about cancer screening? Many types of cancers can be detected early and may often be prevented. Depending on your  health history and family history, you may need to have cancer screening at various ages. This may include screening for: Colorectal cancer. Prostate cancer. Skin cancer. Lung cancer. What should I know about heart disease, diabetes, and high blood pressure? Blood pressure and heart disease High blood pressure causes heart disease and increases the risk of stroke. This is more likely to develop in people who have high blood pressure readings or are overweight. Talk with your health care provider about your target blood pressure readings. Have your blood pressure checked: Every 3-5 years if you are 61-62 years of age. Every year if you are 18 years old or older. If you are between the ages of 76 and 11 and are a current or former smoker, ask your health care provider if you should have a one-time screening for abdominal aortic aneurysm (AAA). Diabetes Have regular diabetes screenings. This checks your fasting blood sugar level. Have the screening done: Once every three years after age 68 if you are at a normal weight and have a low risk for diabetes. More often and at a younger age if you are overweight or have a high risk for diabetes. What should I know about preventing infection? Hepatitis B If you have a higher risk for hepatitis B, you should be screened for this virus. Talk with your health care provider to find out if you are at risk for hepatitis B infection. Hepatitis C Blood testing is recommended for: Everyone born from [birth year] through 1963. Anyone with known risk factors  for hepatitis C. Sexually transmitted infections (STIs) You should be screened each year for STIs, including gonorrhea and chlamydia, if: You are sexually active and are younger than 51 years of age. You are older than 51 years of age and your health care provider tells you that you are at risk for this type of infection. Your sexual activity has changed since you were last screened, and you are at increased risk  for chlamydia or gonorrhea. Ask your health care provider if you are at risk. Ask your health care provider about whether you are at high risk for HIV. Your health care provider may recommend a prescription medicine to help prevent HIV infection. If you choose to take medicine to prevent HIV, you should first get tested for HIV. You should then be tested every 3 months for as long as you are taking the medicine. Follow these instructions at home: Alcohol use Do not drink alcohol if your health care provider tells you not to drink. If you drink alcohol: Limit how much you have to 0-2 drinks a day. Know how much alcohol is in your drink. In the [place] one drink equals one 12 oz bottle of beer (355 mL), one 5 oz glass of wine (148 mL), or one 1 oz glass of hard liquor (44 mL). Lifestyle Do not use any products that contain nicotine or tobacco. These products include cigarettes, chewing tobacco, and vaping devices, such as e-cigarettes. If you need help quitting, ask your health care provider. Do not use street drugs. Do not share needles. Ask your health care provider for help if you need support or information about quitting drugs. General instructions Schedule regular health, dental, and eye exams. Stay current with your vaccines. Tell your health care provider if: You often feel depressed. You have ever been abused or do not feel safe at home. Summary Adopting a healthy lifestyle and getting preventive care are important in promoting health and wellness. Follow your health care provider's instructions about healthy diet, exercising, and getting tested or screened for diseases. Follow your health care provider's instructions on monitoring your cholesterol and blood pressure. This information is not intended to replace advice given to you by your health care provider. Make sure you discuss any questions you have with your health care provider. Document Revised: 03/06/2021 Document Reviewed:  03/06/2021 Elsevier Patient Education  2024 [employer].

## 2024-07-21 ENCOUNTER — Ambulatory Visit (INDEPENDENT_AMBULATORY_CARE_PROVIDER_SITE_OTHER): Admitting: Internal Medicine

## 2024-07-21 VITALS — BP 128/82 | HR 68 | Temp 98.3°F | Ht 71.26 in | Wt 199.0 lb

## 2024-07-21 DIAGNOSIS — E785 Hyperlipidemia, unspecified: Secondary | ICD-10-CM

## 2024-07-21 DIAGNOSIS — Z23 Encounter for immunization: Secondary | ICD-10-CM | POA: Diagnosis not present

## 2024-07-21 DIAGNOSIS — J45909 Unspecified asthma, uncomplicated: Secondary | ICD-10-CM | POA: Diagnosis not present

## 2024-07-21 DIAGNOSIS — E1169 Type 2 diabetes mellitus with other specified complication: Secondary | ICD-10-CM | POA: Diagnosis not present

## 2024-07-21 DIAGNOSIS — J309 Allergic rhinitis, unspecified: Secondary | ICD-10-CM

## 2024-07-21 DIAGNOSIS — E1159 Type 2 diabetes mellitus with other circulatory complications: Secondary | ICD-10-CM

## 2024-07-21 DIAGNOSIS — I152 Hypertension secondary to endocrine disorders: Secondary | ICD-10-CM | POA: Diagnosis not present

## 2024-07-21 DIAGNOSIS — Z0000 Encounter for general adult medical examination without abnormal findings: Secondary | ICD-10-CM

## 2024-07-21 DIAGNOSIS — Z125 Encounter for screening for malignant neoplasm of prostate: Secondary | ICD-10-CM | POA: Diagnosis not present

## 2024-07-21 LAB — COMPREHENSIVE METABOLIC PANEL WITH GFR
ALT: 49 U/L (ref 0–53)
AST: 28 U/L (ref 0–37)
Albumin: 4.3 g/dL (ref 3.5–5.2)
Alkaline Phosphatase: 71 U/L (ref 39–117)
BUN: 17 mg/dL (ref 6–23)
CO2: 30 meq/L (ref 19–32)
Calcium: 9.2 mg/dL (ref 8.4–10.5)
Chloride: 104 meq/L (ref 96–112)
Creatinine, Ser: 1.17 mg/dL (ref 0.40–1.50)
GFR: 72.22 mL/min (ref >60.00)
Glucose, Bld: 110 mg/dL — ABNORMAL HIGH (ref 70–99)
Potassium: 4 meq/L (ref 3.5–5.1)
Sodium: 140 meq/L (ref 135–145)
Total Bilirubin: 0.5 mg/dL (ref 0.2–1.2)
Total Protein: 7 g/dL (ref 6.0–8.3)

## 2024-07-21 LAB — MICROALBUMIN / CREATININE URINE RATIO
Creatinine,U: 205.2 mg/dL
Microalb Creat Ratio: 4.1 mg/g (ref 0.0–30.0)
Microalb, Ur: 0.8 mg/dL (ref 0.0–1.9)

## 2024-07-21 LAB — LIPID PANEL
Cholesterol: 169 mg/dL (ref 0–200)
HDL: 50.5 mg/dL (ref >39.00)
LDL Cholesterol: 99 mg/dL (ref 0–99)
NonHDL: 118.43
Total CHOL/HDL Ratio: 3
Triglycerides: 98 mg/dL (ref 0.0–149.0)
VLDL: 19.6 mg/dL (ref 0.0–40.0)

## 2024-07-21 LAB — HEMOGLOBIN A1C: Hgb A1c MFr Bld: 6.6 % — ABNORMAL HIGH (ref 4.6–6.5)

## 2024-07-21 LAB — PSA: PSA: 0.75 ng/mL (ref 0.10–4.00)

## 2024-07-21 LAB — CBC
HCT: 41.8 % (ref 39.0–52.0)
Hemoglobin: 13.8 g/dL (ref 13.0–17.0)
MCHC: 33 g/dL (ref 30.0–36.0)
MCV: 87 fl (ref 78.0–100.0)
Platelets: 191 K/uL (ref 150.0–400.0)
RBC: 4.81 Mil/uL (ref 4.22–5.81)
RDW: 13.9 % (ref 11.5–15.5)
WBC: 3.7 K/uL — ABNORMAL LOW (ref 4.0–10.5)

## 2024-07-21 LAB — TSH: TSH: 1.66 u[IU]/mL (ref 0.35–5.50)

## 2024-07-21 MED ORDER — LISINOPRIL 20 MG PO TABS: 20.0000 mg | ORAL_TABLET | Freq: Every day | ORAL | 1 refills | Status: AC

## 2024-07-21 MED ORDER — ATORVASTATIN CALCIUM 40 MG PO TABS: 40.0000 mg | ORAL_TABLET | Freq: Every day | ORAL | 1 refills | Status: AC

## 2024-07-21 MED ORDER — MONTELUKAST SODIUM 10 MG PO TABS: 10.0000 mg | ORAL_TABLET | Freq: Every day | ORAL | 3 refills | Status: AC

## 2024-07-21 MED ORDER — AMLODIPINE BESYLATE 10 MG PO TABS: 10.0000 mg | ORAL_TABLET | Freq: Every day | ORAL | 1 refills | Status: AC

## 2024-07-21 NOTE — Assessment & Plan Note (Addendum)
 Chronic Associated with hyperlipidemia Lab Results  Component Value Date   HGBA1C 7.0 (H) 01/15/2024   A1c, urine albumin/creatinine ratio Has CGM -sugars well-controlled Diet controlled-would like to avoid medication Stressed regular exercise, diabetic diet-he continues to work on both

## 2024-07-21 NOTE — Assessment & Plan Note (Signed)
Chronic Controlled Continue Zyrtec 10 mg daily, montelukast 10 mg nightly

## 2024-07-21 NOTE — Assessment & Plan Note (Signed)
 Chronic BP controlled Continue lisinopril  20 mg daily, amlodipine  10 mg daily cmp, CBC, TSH

## 2024-07-21 NOTE — Addendum Note (Signed)
 Addended by: CLAUDENE TOBIAS PARAS on: 07/21/2024 12:07 PM   Modules accepted: Orders

## 2024-07-21 NOTE — Assessment & Plan Note (Addendum)
 Chronic Check lipid panel, cmp, TSH Continue atorvastatin  40 mg daily Regular exercise and healthy diet encouraged

## 2024-07-21 NOTE — Assessment & Plan Note (Signed)
 Chronic Exercise induced, allergy related Uses albuterol prn

## 2024-07-22 ENCOUNTER — Ambulatory Visit: Payer: Self-pay | Admitting: Internal Medicine

## 2024-08-18 ENCOUNTER — Other Ambulatory Visit: Payer: Self-pay | Admitting: Medical Genetics

## 2024-08-18 DIAGNOSIS — Z006 Encounter for examination for normal comparison and control in clinical research program: Secondary | ICD-10-CM

## 2024-09-21 LAB — GENECONNECT MOLECULAR SCREEN: Genetic Analysis Overall Interpretation: NEGATIVE

## 2024-10-12 LAB — LAB REPORT - SCANNED
A1c: 6.3
EGFR: 67

## 2025-01-18 ENCOUNTER — Ambulatory Visit: Admitting: Internal Medicine
